# Patient Record
Sex: Female | Born: 1960 | Race: Black or African American | Hispanic: No | Marital: Single | Smoking: Current every day smoker
Health system: Southern US, Community
[De-identification: ages and names within clinical notes are randomized; demographics above are authoritative.]

## PROBLEM LIST (undated history)

## (undated) DIAGNOSIS — M5136 Other intervertebral disc degeneration, lumbar region: Secondary | ICD-10-CM

## (undated) DIAGNOSIS — E079 Disorder of thyroid, unspecified: Secondary | ICD-10-CM

## (undated) DIAGNOSIS — I10 Essential (primary) hypertension: Secondary | ICD-10-CM

## (undated) DIAGNOSIS — Z9289 Personal history of other medical treatment: Secondary | ICD-10-CM

## (undated) HISTORY — PX: BREAST CYST EXCISION: SHX579

## (undated) HISTORY — DX: Disorder of thyroid, unspecified: E07.9

## (undated) HISTORY — DX: Personal history of other medical treatment: Z92.89

## (undated) HISTORY — PX: WRIST SURGERY: SHX841

---

## 2017-12-15 ENCOUNTER — Other Ambulatory Visit: Payer: Self-pay

## 2017-12-15 ENCOUNTER — Encounter (HOSPITAL_COMMUNITY): Payer: Self-pay

## 2017-12-15 ENCOUNTER — Emergency Department (HOSPITAL_COMMUNITY): Payer: Medicaid - Out of State

## 2017-12-15 DIAGNOSIS — F17210 Nicotine dependence, cigarettes, uncomplicated: Secondary | ICD-10-CM | POA: Insufficient documentation

## 2017-12-15 DIAGNOSIS — R110 Nausea: Secondary | ICD-10-CM | POA: Diagnosis not present

## 2017-12-15 DIAGNOSIS — R1031 Right lower quadrant pain: Secondary | ICD-10-CM | POA: Diagnosis present

## 2017-12-15 DIAGNOSIS — I10 Essential (primary) hypertension: Secondary | ICD-10-CM | POA: Diagnosis not present

## 2017-12-15 DIAGNOSIS — R1033 Periumbilical pain: Secondary | ICD-10-CM | POA: Insufficient documentation

## 2017-12-15 DIAGNOSIS — R350 Frequency of micturition: Secondary | ICD-10-CM | POA: Diagnosis not present

## 2017-12-15 DIAGNOSIS — Z79899 Other long term (current) drug therapy: Secondary | ICD-10-CM | POA: Insufficient documentation

## 2017-12-15 LAB — COMPREHENSIVE METABOLIC PANEL
ALBUMIN: 4 g/dL (ref 3.5–5.0)
ALK PHOS: 71 U/L (ref 38–126)
ALT: 15 U/L (ref 14–54)
AST: 22 U/L (ref 15–41)
Anion gap: 13 (ref 5–15)
BUN: 11 mg/dL (ref 6–20)
CHLORIDE: 107 mmol/L (ref 101–111)
CO2: 22 mmol/L (ref 22–32)
Calcium: 9.3 mg/dL (ref 8.9–10.3)
Creatinine, Ser: 0.96 mg/dL (ref 0.44–1.00)
GFR calc non Af Amer: >60 mL/min (ref >60)
GLUCOSE: 102 mg/dL — AB (ref 65–99)
POTASSIUM: 3.8 mmol/L (ref 3.5–5.1)
SODIUM: 142 mmol/L (ref 135–145)
Total Bilirubin: 1.5 mg/dL — ABNORMAL HIGH (ref 0.3–1.2)
Total Protein: 7.1 g/dL (ref 6.5–8.1)

## 2017-12-15 LAB — URINALYSIS, ROUTINE W REFLEX MICROSCOPIC
Bilirubin Urine: NEGATIVE
Glucose, UA: NEGATIVE mg/dL
KETONES UR: 5 mg/dL — AB
Leukocytes, UA: NEGATIVE
Nitrite: NEGATIVE
PROTEIN: NEGATIVE mg/dL
Specific Gravity, Urine: 1.011 (ref 1.005–1.030)
pH: 6 (ref 5.0–8.0)

## 2017-12-15 LAB — CBC
HEMATOCRIT: 45 % (ref 36.0–46.0)
Hemoglobin: 14.8 g/dL (ref 12.0–15.0)
MCH: 28.2 pg (ref 26.0–34.0)
MCHC: 32.9 g/dL (ref 30.0–36.0)
MCV: 85.7 fL (ref 78.0–100.0)
Platelets: 157 10*3/uL (ref 150–400)
RBC: 5.25 MIL/uL — AB (ref 3.87–5.11)
RDW: 14.4 % (ref 11.5–15.5)
WBC: 9.4 10*3/uL (ref 4.0–10.5)

## 2017-12-15 LAB — LIPASE, BLOOD: LIPASE: 25 U/L (ref 11–51)

## 2017-12-15 MED ORDER — ONDANSETRON 4 MG PO TBDP
4.0000 mg | ORAL_TABLET | Freq: Once | ORAL | Status: AC | PRN
  Administered 2017-12-15: 4 mg via ORAL
  Filled 2017-12-15: qty 1

## 2017-12-15 MED ORDER — IOPAMIDOL (ISOVUE-300) INJECTION 61%
INTRAVENOUS | Status: AC
  Administered 2017-12-15: 100 mL
  Filled 2017-12-15: qty 100

## 2017-12-15 NOTE — ED Triage Notes (Signed)
Pt here for abdominal pain, sudden onset around 1200 today. States pain begins at her navel and radiates to her RLQ. Pt also reports nausea.

## 2017-12-15 NOTE — ED Notes (Signed)
Pt came to nurse first to ask when "MRI" was coming to get her.  No orders for MRI.  Called Hedges, PA and he was away a CT abd was supposed to be ordered, PA thought nurse put order in.

## 2017-12-16 ENCOUNTER — Emergency Department (HOSPITAL_COMMUNITY)
Admission: EM | Admit: 2017-12-16 | Discharge: 2017-12-16 | Disposition: A | Discharge status: Home / Self Care | Payer: Medicaid - Out of State | Attending: Emergency Medicine | Admitting: Emergency Medicine

## 2017-12-16 DIAGNOSIS — R1031 Right lower quadrant pain: Secondary | ICD-10-CM

## 2017-12-16 HISTORY — DX: Other intervertebral disc degeneration, lumbar region: M51.36

## 2017-12-16 HISTORY — DX: Essential (primary) hypertension: I10

## 2017-12-16 NOTE — ED Notes (Signed)
Pt states that she does not want the IV in her arm and will be re stuck if necessary

## 2017-12-16 NOTE — ED Provider Notes (Signed)
[facility] EMERGENCY DEPARTMENT Provider Note   CSN: [id number] Arrival date & time: 12/15/17  1443     History   Chief Complaint Chief Complaint  Patient presents with  . Abdominal Pain    HPI Cheyenne Patel is a 57 y.o. female with past medical history of hypertension, degenerative disc disease, cholelithiasis, presenting to the ED for acute onset of periumbilical abdominal pain that moved towards her right lower quadrant that began at noon on Tuesday.  Patient states pain is 5/10, with associated nausea.  She states nausea is improved by Zofran that was given in triage. Reports mild inc in urinary frequency. Denies fever or chills,  vomiting, diarrhea, constipation, vaginal bleeding or discharge.  Patient states her pain does not feel similar to when she was symptomatic with cholelithiasis.  No other complaints.  The history is provided by the patient.    Past Medical History:  Diagnosis Date  . DDD (degenerative disc disease), lumbar   . Hypertension     There are no active problems to display for this patient.   History reviewed. No pertinent surgical history.  OB History    No data available       Home Medications    Prior to Admission medications   Medication Sig Start Date End Date Taking? Authorizing Provider  amLODipine (NORVASC) 10 MG tablet Take 10 mg by mouth daily.   Yes [provider]  ibuprofen (ADVIL,MOTRIN) 200 MG tablet Take 400 mg by mouth every 6 (six) hours as needed.   Yes [provider]    Family History History reviewed. No pertinent family history.  Social History Social History   Tobacco Use  . Smoking status: Current Every Day Smoker    Packs/day: 0.10  . Smokeless tobacco: Never Used  Substance Use Topics  . Alcohol use: No    Frequency: Never  . Drug use: Not on file     Allergies   Patient has no known allergies.   Review of Systems Review of Systems  Constitutional: Negative  for appetite change, chills and fever.  Gastrointestinal: Positive for abdominal pain and nausea. Negative for constipation, diarrhea and vomiting.  Genitourinary: Positive for frequency. Negative for dysuria, vaginal bleeding and vaginal discharge.  All other systems reviewed and are negative.    Physical Exam Updated Vital Signs BP 133/85   Pulse 78   Temp 97.9 F (36.6 C) (Oral)   Resp 20   LMP 11/10/2004   SpO2 99%   Physical Exam  Constitutional: She appears well-developed and well-nourished. No distress.  Pt sleeping comfortably on evaluation.   HENT:  Head: Normocephalic and atraumatic.  Mouth/Throat: Oropharynx is clear and moist.  Eyes: Conjunctivae are normal.  Cardiovascular: Normal rate, regular rhythm, normal heart sounds and intact distal pulses.  Pulmonary/Chest: Effort normal and breath sounds normal. No respiratory distress.  Abdominal: Soft. Normal appearance and bowel sounds are normal. She exhibits no distension and no mass. There is tenderness (minimal) in the left lower quadrant. There is no rigidity, no rebound, no guarding and negative Murphy's sign. No hernia.  Neurological: She is alert.  Skin: Skin is warm.  Psychiatric: She has a normal mood and affect. Her behavior is normal.  Nursing note and vitals reviewed.    ED Treatments / Results  Labs (all labs ordered are listed, but only abnormal results are displayed) Labs Reviewed  COMPREHENSIVE METABOLIC PANEL - Abnormal; Notable for the following components:      Result Value  Glucose, Bld 102 (*)    Total Bilirubin 1.5 (*)    All other components within normal limits  CBC - Abnormal; Notable for the following components:   RBC 5.25 (*)    All other components within normal limits  URINALYSIS, ROUTINE W REFLEX MICROSCOPIC - Abnormal; Notable for the following components:   Hgb urine dipstick LARGE (*)    Ketones, ur 5 (*)    Bacteria, UA RARE (*)    Squamous Epithelial / LPF 0-5 (*)    All  other components within normal limits  LIPASE, BLOOD    EKG  EKG Interpretation None       Radiology Ct Abdomen Pelvis W Contrast  Result Date: 12/15/2017 CLINICAL DATA:  Acute onset of lower abdominal pain, radiating to the back. Nausea. EXAM: CT ABDOMEN AND PELVIS WITH CONTRAST TECHNIQUE: Multidetector CT imaging of the abdomen and pelvis was performed using the standard protocol following bolus administration of intravenous contrast. CONTRAST:  186mL ISOVUE-300 IOPAMIDOL (ISOVUE-300) INJECTION 61% COMPARISON:  None. FINDINGS: Lower chest: Minimal scarring is noted at the lung bases. Scattered coronary artery calcifications are seen. Hepatobiliary: The liver is unremarkable in appearance. Stones are seen within the gallbladder. There is question of trace pericholecystic fluid. Would correlate clinically for evidence of cholecystitis. The common bile duct remains normal in caliber. Pancreas: The pancreas is within normal limits. Spleen: The spleen is unremarkable in appearance. Adrenals/Urinary Tract: A small right renal cyst is noted. Nonspecific right-sided perinephric fluid is noted. The left kidney is unremarkable in appearance. No renal or ureteral stones are identified. The adrenal glands are unremarkable in appearance. Stomach/Bowel: The stomach is unremarkable in appearance. The small bowel is within normal limits. The appendix is normal in caliber, without evidence of appendicitis. The colon is unremarkable in appearance. Vascular/Lymphatic: Scattered calcification is seen along the abdominal aorta and its branches. The abdominal aorta is otherwise grossly unremarkable. The inferior vena cava is grossly unremarkable. No retroperitoneal lymphadenopathy is seen. No pelvic sidewall lymphadenopathy is identified. Reproductive: The bladder is decompressed and not well assessed. The uterus is unremarkable in appearance. The ovaries are grossly symmetric. No suspicious adnexal masses are seen. Other:  No additional soft tissue abnormalities are seen. Musculoskeletal: No acute osseous abnormalities are identified. Facet disease is noted at the lower lumbar spine. The visualized musculature is unremarkable in appearance. IMPRESSION: 1. Cholelithiasis. Question of trace pericholecystic fluid. Would correlate clinically for evidence of cholecystitis. 2. Nonspecific right-sided perinephric fluid. Small right renal cyst. 3. Scattered coronary artery calcifications. Aortic Atherosclerosis (ICD10-I70.0). Electronically Signed   By: Garald Balding M.D.   On: 12/15/2017 21:53    Procedures Procedures (including critical care time)  Medications Ordered in ED Medications  ondansetron (ZOFRAN-ODT) disintegrating tablet 4 mg (4 mg Oral Given 12/15/17 1614)  iopamidol (ISOVUE-300) 61 % injection (100 mLs  Contrast Given 12/15/17 2135)     Initial Impression / Assessment and Plan / ED Course  I have reviewed the triage vital signs and the nursing notes.  Pertinent labs & imaging results that were available during my care of the patient were reviewed by me and considered in my medical decision making (see chart for details).     Pt w RLQ abdominal pain. Patient is nontoxic, nonseptic appearing, in no apparent distress.  Patient's pain and other symptoms adequately managed in emergency department.  Labs, imaging and vitals reviewed. CT without evidence of appendicitis, or other acute abdominal pathology; does note cholelithiasis, though neg murphys sign and pt nontender  in RUQ; doubt cholecystitis - No leukocytosis. Pt well-appearing, not in distress. No pelvic complaints. Patient does not meet the SIRS or Sepsis criteria.  On exam patient does not have a surgical abdomen and there are no peritoneal signs.  No indication of appendicitis, bowel obstruction, bowel perforation, cholecystitis, diverticulitis. Patient discharged home with symptomatic treatment and given strict instructions for follow-up with their  primary care physician.  Pt safe for discharge.  Discussed results, findings, treatment and follow up. Patient advised of return precautions. Patient verbalized understanding and agreed with plan.  Final Clinical Impressions(s) / ED Diagnoses   Final diagnoses:  RLQ abdominal pain    ED Discharge Orders    None       Robinson, [place] N, PA-C 12/16/17 0518    Palumbo, April, MD 12/16/17 2947

## 2017-12-16 NOTE — Discharge Instructions (Signed)
Please schedule an appointment to follow up on your visit. You can take tylenol as needed for pain. Return to the ER if symptoms persist or worsen, fever, uncontrollable vomiting, or new or concerning symptoms.

## 2017-12-30 ENCOUNTER — Other Ambulatory Visit: Payer: Self-pay

## 2017-12-30 ENCOUNTER — Encounter (HOSPITAL_COMMUNITY): Payer: Self-pay | Admitting: *Deleted

## 2017-12-30 ENCOUNTER — Emergency Department (HOSPITAL_COMMUNITY): Payer: Medicaid - Out of State

## 2017-12-30 ENCOUNTER — Emergency Department (HOSPITAL_COMMUNITY)
Admission: EM | Admit: 2017-12-30 | Discharge: 2017-12-30 | Disposition: A | Discharge status: Home / Self Care | Payer: Medicaid - Out of State | Attending: Emergency Medicine | Admitting: Emergency Medicine

## 2017-12-30 DIAGNOSIS — Y92002 Bathroom of unspecified non-institutional (private) residence single-family (private) house as the place of occurrence of the external cause: Secondary | ICD-10-CM | POA: Diagnosis not present

## 2017-12-30 DIAGNOSIS — Y998 Other external cause status: Secondary | ICD-10-CM | POA: Diagnosis not present

## 2017-12-30 DIAGNOSIS — W182XXA Fall in (into) shower or empty bathtub, initial encounter: Secondary | ICD-10-CM | POA: Insufficient documentation

## 2017-12-30 DIAGNOSIS — I10 Essential (primary) hypertension: Secondary | ICD-10-CM | POA: Diagnosis not present

## 2017-12-30 DIAGNOSIS — W19XXXA Unspecified fall, initial encounter: Secondary | ICD-10-CM

## 2017-12-30 DIAGNOSIS — Z79899 Other long term (current) drug therapy: Secondary | ICD-10-CM | POA: Insufficient documentation

## 2017-12-30 DIAGNOSIS — M545 Low back pain: Secondary | ICD-10-CM | POA: Insufficient documentation

## 2017-12-30 DIAGNOSIS — Y93E1 Activity, personal bathing and showering: Secondary | ICD-10-CM | POA: Insufficient documentation

## 2017-12-30 DIAGNOSIS — F17200 Nicotine dependence, unspecified, uncomplicated: Secondary | ICD-10-CM | POA: Insufficient documentation

## 2017-12-30 MED ORDER — METHOCARBAMOL 500 MG PO TABS: 500.0000 mg | ORAL_TABLET | Freq: Two times a day (BID) | ORAL | 0 refills | Status: DC

## 2017-12-30 NOTE — ED Notes (Signed)
Patient asking for graham crackers - given a few packs.

## 2017-12-30 NOTE — Discharge Instructions (Signed)
Please take ibuprofen 600mg  every 6hrs as needed for back pain  You can also apply heat to the lower back to help with your symptoms.   I have written you a prescription for a muscle relaxer medicine should you choose to fill it. This medicine can make you drowsy so please do not drive, work or drink alcohol while taking it.   Follow up with your regular doctor if your symptoms are not improving in a week.   Return to the ER if you lose sensation in  your legs or feet, are unable to walk, lose bowel or bladder control or have any new or worsening symptoms.

## 2017-12-30 NOTE — ED Notes (Signed)
Patient transported to X-ray 

## 2017-12-30 NOTE — ED Triage Notes (Signed)
Pt reports slipping and falling this am in the shower. Now has lower back pain that radiates down her legs. Hx of same. Ambulatory at triage.

## 2017-12-30 NOTE — ED Provider Notes (Signed)
[facility] EMERGENCY DEPARTMENT Provider Note   CSN: [id number] Arrival date & time: 12/30/17  1937     History   Chief Complaint Chief Complaint  Patient presents with  . Fall  . Back Pain    HPI Cheyenne Patel is a 57 y.o. female.  HPI   Cheyenne Patel is a 57 year old female with history of degenerative disc disease and hypertension who presents to the emergency department for evaluation of back pain following a mechanical fall earlier today.  Patient states that she slipped and fell onto her right lower back while in the tub at about 7AM today.  Denies hitting her head or loss of consciousness.  States that she now has 7/10 severity right sided lower back pain which radiates to the posterior buttocks and thigh.  Pain is worsened with standing from a seated position.  She has not taken any over-the-counter medications for her symptoms.  She denies numbness, weakness, loss of bowel or bladder control, saddle anesthesia.  Is able to ambulate independently, although painful.  Past Medical History:  Diagnosis Date  . DDD (degenerative disc disease), lumbar   . Hypertension     There are no active problems to display for this patient.   History reviewed. No pertinent surgical history.  OB History    No data available       Home Medications    Prior to Admission medications   Medication Sig Start Date End Date Taking? Authorizing Provider  amLODipine (NORVASC) 10 MG tablet Take 10 mg by mouth daily.    [provider]  ibuprofen (ADVIL,MOTRIN) 200 MG tablet Take 400 mg by mouth every 6 (six) hours as needed.    [provider]    Family History History reviewed. No pertinent family history.  Social History Social History   Tobacco Use  . Smoking status: Current Every Day Smoker    Packs/day: 0.10  . Smokeless tobacco: Never Used  Substance Use Topics  . Alcohol use: No    Frequency: Never  . Drug use: Not on file      Allergies   Patient has no known allergies.   Review of Systems Review of Systems  Gastrointestinal: Negative for abdominal pain, nausea and vomiting.  Genitourinary: Negative for difficulty urinating, dysuria, frequency and hematuria.  Musculoskeletal: Positive for back pain. Negative for gait problem.  Skin: Negative for wound.  Neurological: Negative for weakness, numbness and headaches.     Physical Exam Updated Vital Signs BP 136/89 (BP Location: Right Arm)   Pulse 79   Temp 98.6 F (37 C) (Oral)   Resp 16   LMP 11/10/2004   SpO2 98%   Physical Exam  Constitutional: She is oriented to person, place, and time. She appears well-developed and well-nourished. No distress.  HENT:  Head: Normocephalic and atraumatic.  Eyes: Right eye exhibits no discharge. Left eye exhibits no discharge.  Pulmonary/Chest: Effort normal. No respiratory distress.  Abdominal: Soft. Bowel sounds are normal. There is no tenderness. There is no guarding.  No CVA tenderness.  Musculoskeletal:  Tenderness over several spinous processes of the lumbar spine.  Tender over right paraspinal muscles of the lumbar spine.  No step-off or deformity.  No ecchymosis over the back.  5/5 strength in bilateral ankle dorsiflexion/plantar flexion and knee extension/flexion.  Straight leg raise positive on the right.  DP pulses 2+ bilaterally.  Neurological: She is alert and oriented to person, place, and time. Coordination normal.  Patellar reflex 2+ bilaterally.  Distal sensation to light touch intact in bilateral lower extremities.  Gait normal and coordination and balance.  Skin: Skin is warm and dry. Capillary refill takes less than 2 seconds. She is not diaphoretic.  Psychiatric: She has a normal mood and affect. Her behavior is normal.  Nursing note and vitals reviewed.     ED Treatments / Results  Labs (all labs ordered are listed, but only abnormal results are displayed) Labs Reviewed - No data to  display  EKG  EKG Interpretation None       Radiology Dg Lumbar Spine Complete  Result Date: 12/30/2017 CLINICAL DATA:  Pain following fall EXAM: LUMBAR SPINE - COMPLETE 4+ VIEW COMPARISON:  None. FINDINGS: Frontal, lateral, spot lumbosacral lateral, and bilateral oblique views were obtained. There are 5 non-rib-bearing lumbar type vertebral bodies. Note that at L1, the transverse processes are slightly elongated, particularly on the right. There is no fracture or spondylolisthesis. The disc spaces appear unremarkable. There is facet osteoarthritic change at L4-5 and L5-S1 bilaterally. There are foci of aortic atherosclerosis. 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 No appreciable disc space narrowing. No fracture or spondylolisthesis. There is aortic atherosclerosis. Aortic Atherosclerosis (ICD10-I70.0). Electronically Signed   By: Lowella Grip III M.D.   On: 12/30/2017 12:42    Procedures Procedures (including critical care time)  Medications Ordered in ED Medications - No data to display   Initial Impression / Assessment and Plan / ED Course  I have reviewed the triage vital signs and the nursing notes.  Pertinent labs & imaging results that were available during my care of the patient were reviewed by me and considered in my medical decision making (see chart for details).     Patient presents with lower back pain following a mechanical fall earlier today.  X-ray of lumbar spine without acute fracture or abnormality. No neurological deficits and normal neuro exam.  Patient can walk but states is painful. No loss of bowel or bladder control. No concern for cauda equina.  RICE protocol and pain medicine indicated and discussed with patient.  Counseled patient to follow-up with her PCP if symptoms do not improve in a week.  Discussed return precautions and patient agrees and voiced understanding to plan.  Final Clinical Impressions(s) / ED Diagnoses    Final diagnoses:  Fall, initial encounter    ED Discharge Orders        Ordered    methocarbamol (ROBAXIN) 500 MG tablet  2 times daily     12/30/17 1308       Bernarda Caffey 12/30/17 1310    Gareth Morgan, MD 12/30/17 2147

## 2018-03-15 ENCOUNTER — Other Ambulatory Visit: Payer: Self-pay

## 2018-03-15 ENCOUNTER — Ambulatory Visit: Payer: Medicaid - Out of State | Admitting: Family Medicine

## 2018-03-15 ENCOUNTER — Ambulatory Visit (INDEPENDENT_AMBULATORY_CARE_PROVIDER_SITE_OTHER): Payer: Self-pay | Admitting: Family Medicine

## 2018-03-15 ENCOUNTER — Other Ambulatory Visit: Payer: Self-pay | Admitting: Family Medicine

## 2018-03-15 ENCOUNTER — Encounter: Payer: Self-pay | Admitting: Family Medicine

## 2018-03-15 ENCOUNTER — Telehealth: Payer: Self-pay | Admitting: Family Medicine

## 2018-03-15 VITALS — BP 142/98 | HR 70 | Temp 98.0°F | Ht 65.0 in | Wt 201.6 lb

## 2018-03-15 DIAGNOSIS — R3915 Urgency of urination: Secondary | ICD-10-CM

## 2018-03-15 DIAGNOSIS — M5386 Other specified dorsopathies, lumbar region: Secondary | ICD-10-CM | POA: Insufficient documentation

## 2018-03-15 LAB — POCT URINALYSIS DIP (MANUAL ENTRY)
Bilirubin, UA: NEGATIVE
Blood, UA: NEGATIVE
GLUCOSE UA: NEGATIVE mg/dL
Ketones, POC UA: NEGATIVE mg/dL
Leukocytes, UA: NEGATIVE
Nitrite, UA: NEGATIVE
PROTEIN UA: NEGATIVE mg/dL
SPEC GRAV UA: 1.02 (ref 1.010–1.025)
UROBILINOGEN UA: 0.2 U/dL
pH, UA: 6.5 (ref 5.0–8.0)

## 2018-03-15 MED ORDER — GABAPENTIN 100 MG PO CAPS: 100.0000 mg | ORAL_CAPSULE | Freq: Every day | ORAL | 3 refills | Status: DC

## 2018-03-15 NOTE — Patient Instructions (Signed)
It was great to meet you today! Thank you for letting me participate in your care!  Today, we discussed your lower back pain. I believe your low back pain is likely due to arthritic changes in your lumbar spine. Please take Tylenol and Ibuprofen together to help control the pain. I also believe you have Sciatica that is causing pain and burning in your right buttocks and right leg. I have prescribed you Gabapentin to take daily at night to help control the pain. Please also do the stretches everyday to help improve your symptoms. It may take two weeks to begin seeing improvement.  Please return in two weeks to see how you are doing and to do discuss routine health maintenance.   Be well, Harolyn Rutherford, DO PGY-1, [facility]

## 2018-03-15 NOTE — Telephone Encounter (Signed)
Pt would like her gabapentin sent to the [facility] on [address] instead of [address]. Pt said it's too expensive at [facility]. Please advise

## 2018-03-15 NOTE — Progress Notes (Signed)
Sending gabapentin prescription to different pharmacy due to cost being too high at [facility].

## 2018-03-15 NOTE — Progress Notes (Cosign Needed)
     Subjective: Chief Complaint  Patient presents with  . Establish Care    HPI: Cheyenne Patel is a 57 y.o. presenting to clinic today to discuss the following:  Right sided buttock, hip, and leg pain Patient is new and establishing care today. Mainly today she is concerned about not being able to sleep well for the past 4 months due to right sided pain occurring in her hip and buttocks. The pain is described as burning gets to a 10/10 at times and radiates down the back of her right leg. She has more pain when sleeping on her right side and in the morning. It improves with position changes and walking. It is occurring everyday intermittently.  She denies fever, chills, weight loss, CP, SOB, difficulty breathing, abdominal pain, diarrhea, constipation, nausea, vomiting, or joint pain  Health Maintenance: None today    ROS noted in HPI.   Past Medical, Surgical, Social, and Family History Reviewed & Updated per EMR.   Pertinent Historical Findings include: History of Hyperthyroidism followed by Hypothyroidism  Social History   Tobacco Use  Smoking Status Current Every Day Smoker  . Packs/day: 0.10  Smokeless Tobacco Never Used   Objective: BP (!) 142/98   Pulse 70   Temp 98 F (36.7 C) (Oral)   Ht 5\' 5"  (1.651 m)   Wt 201 lb 9.6 oz (91.4 kg)   LMP 11/10/2004   SpO2 99%   BMI 33.55 kg/m  Vitals and nursing notes reviewed  Physical Exam  Constitutional: She is oriented to person, place, and time. She appears well-developed and well-nourished. No distress.  HENT:  Head: Normocephalic and atraumatic.  Eyes: Pupils are equal, round, and reactive to light. EOM are normal.  Neck: Normal range of motion. No thyromegaly present.  Cardiovascular: Normal rate, regular rhythm, normal heart sounds and intact distal pulses.  No murmur heard. Pulmonary/Chest: Effort normal and breath sounds normal. No respiratory distress. She has no wheezes. She has no rales.  Abdominal:  Soft. Bowel sounds are normal. She exhibits no distension. There is no tenderness. There is no guarding.  Musculoskeletal: Normal range of motion. She exhibits no edema.  TTP at greater trochanter on the right, ROM wnl for right hip and knee   Lymphadenopathy:    She has no cervical adenopathy.  Neurological: She is alert and oriented to person, place, and time.  Skin: Skin is warm and dry. Capillary refill takes less than 2 seconds. No rash noted. No erythema.  Psychiatric: She has a normal mood and affect.   No results found for this or any previous visit (from the past 72 hour(s)).  Assessment/Plan:  No problem-specific Assessment & Plan notes found for this encounter.     PATIENT EDUCATION PROVIDED: See AVS    Diagnosis and plan along with any newly prescribed medication(s) were discussed in detail with this patient today. The patient verbalized understanding and agreed with the plan. Patient advised if symptoms worsen return to clinic or ER.   Health Maintainance:   Orders Placed This Encounter  Procedures  . POCT urinalysis dipstick    Meds ordered this encounter  Medications  . DISCONTD: gabapentin (NEURONTIN) 100 MG capsule    Sig: Take 1 capsule (100 mg total) by mouth at bedtime.    Dispense:  90 capsule    Refill:  [place], DO 03/17/2018, 1:49 PM PGY-1, [facility]

## 2018-03-17 ENCOUNTER — Encounter: Payer: Self-pay | Admitting: Family Medicine

## 2018-03-17 DIAGNOSIS — E079 Disorder of thyroid, unspecified: Secondary | ICD-10-CM | POA: Insufficient documentation

## 2018-03-17 DIAGNOSIS — I10 Essential (primary) hypertension: Secondary | ICD-10-CM | POA: Insufficient documentation

## 2018-03-17 NOTE — Assessment & Plan Note (Signed)
Her pain is most likely caused by sciatic nerve impingement given symptoms and exam findings. Could also be a component of trochanteric bursitis.  She also has facet degeneration in her lumbar spine that could be contributing to her condition.  Recommended stretches for her to due everyday to help piriformis muscle. Prescribed Gabapentin 100mg  daily at night to help with nerve pain.  Cont Ibuprofen and Tylenol together for back pain.

## 2018-04-06 ENCOUNTER — Ambulatory Visit: Payer: Self-pay | Admitting: Family Medicine

## 2018-04-21 ENCOUNTER — Ambulatory Visit: Payer: Self-pay

## 2018-05-05 ENCOUNTER — Ambulatory Visit: Payer: Self-pay

## 2018-05-20 ENCOUNTER — Emergency Department (HOSPITAL_COMMUNITY)
Admission: EM | Admit: 2018-05-20 | Discharge: 2018-05-20 | Disposition: A | Discharge status: Home / Self Care | Payer: Medicaid - Out of State | Attending: Emergency Medicine | Admitting: Emergency Medicine

## 2018-05-20 ENCOUNTER — Encounter (HOSPITAL_COMMUNITY): Payer: Self-pay | Admitting: Emergency Medicine

## 2018-05-20 ENCOUNTER — Other Ambulatory Visit: Payer: Self-pay

## 2018-05-20 ENCOUNTER — Emergency Department (HOSPITAL_COMMUNITY): Payer: Medicaid - Out of State

## 2018-05-20 DIAGNOSIS — Z79899 Other long term (current) drug therapy: Secondary | ICD-10-CM | POA: Diagnosis not present

## 2018-05-20 DIAGNOSIS — I10 Essential (primary) hypertension: Secondary | ICD-10-CM | POA: Diagnosis not present

## 2018-05-20 DIAGNOSIS — M545 Low back pain: Secondary | ICD-10-CM | POA: Diagnosis not present

## 2018-05-20 DIAGNOSIS — R7989 Other specified abnormal findings of blood chemistry: Secondary | ICD-10-CM

## 2018-05-20 DIAGNOSIS — R1310 Dysphagia, unspecified: Secondary | ICD-10-CM | POA: Insufficient documentation

## 2018-05-20 DIAGNOSIS — F17210 Nicotine dependence, cigarettes, uncomplicated: Secondary | ICD-10-CM | POA: Insufficient documentation

## 2018-05-20 LAB — CK: CK TOTAL: 331 U/L — AB (ref 38–234)

## 2018-05-20 LAB — COMPREHENSIVE METABOLIC PANEL
ALT: 15 U/L (ref 0–44)
ANION GAP: 13 (ref 5–15)
AST: 23 U/L (ref 15–41)
Albumin: 4 g/dL (ref 3.5–5.0)
Alkaline Phosphatase: 62 U/L (ref 38–126)
BILIRUBIN TOTAL: 1.6 mg/dL — AB (ref 0.3–1.2)
BUN: 16 mg/dL (ref 6–20)
CALCIUM: 9.1 mg/dL (ref 8.9–10.3)
CO2: 23 mmol/L (ref 22–32)
Chloride: 104 mmol/L (ref 98–111)
Creatinine, Ser: 1.59 mg/dL — ABNORMAL HIGH (ref 0.44–1.00)
GFR, EST AFRICAN AMERICAN: 41 mL/min — AB (ref >60)
GFR, EST NON AFRICAN AMERICAN: 36 mL/min — AB (ref >60)
Glucose, Bld: 129 mg/dL — ABNORMAL HIGH (ref 70–99)
Potassium: 3.6 mmol/L (ref 3.5–5.1)
Sodium: 140 mmol/L (ref 135–145)
TOTAL PROTEIN: 7 g/dL (ref 6.5–8.1)

## 2018-05-20 LAB — CBC
HCT: 47.8 % — ABNORMAL HIGH (ref 36.0–46.0)
HEMOGLOBIN: 15.2 g/dL — AB (ref 12.0–15.0)
MCH: 27.4 pg (ref 26.0–34.0)
MCHC: 31.8 g/dL (ref 30.0–36.0)
MCV: 86.3 fL (ref 78.0–100.0)
Platelets: 158 10*3/uL (ref 150–400)
RBC: 5.54 MIL/uL — AB (ref 3.87–5.11)
RDW: 13.7 % (ref 11.5–15.5)
WBC: 9.5 10*3/uL (ref 4.0–10.5)

## 2018-05-20 LAB — I-STAT BETA HCG BLOOD, ED (MC, WL, AP ONLY): HCG, QUANTITATIVE: 6.3 m[IU]/mL — AB (ref <5)

## 2018-05-20 LAB — TSH: TSH: 0.094 u[IU]/mL — AB (ref 0.350–4.500)

## 2018-05-20 LAB — I-STAT TROPONIN, ED: TROPONIN I, POC: 0.01 ng/mL (ref 0.00–0.08)

## 2018-05-20 LAB — LIPASE, BLOOD: Lipase: 28 U/L (ref 11–51)

## 2018-05-20 LAB — SEDIMENTATION RATE: SED RATE: 3 mm/h (ref 0–22)

## 2018-05-20 MED ORDER — ORPHENADRINE CITRATE ER 100 MG PO TB12
100.0000 mg | ORAL_TABLET | Freq: Two times a day (BID) | ORAL | Status: DC
  Administered 2018-05-20: 100 mg via ORAL
  Filled 2018-05-20: qty 1

## 2018-05-20 MED ORDER — OMEPRAZOLE 20 MG PO CPDR: 20.0000 mg | DELAYED_RELEASE_CAPSULE | Freq: Every day | ORAL | 0 refills | Status: DC

## 2018-05-20 MED ORDER — ORPHENADRINE CITRATE ER 100 MG PO TB12: 100.0000 mg | ORAL_TABLET | Freq: Two times a day (BID) | ORAL | 0 refills | Status: DC

## 2018-05-20 MED ORDER — TRAMADOL HCL 50 MG PO TABS: ORAL_TABLET | ORAL | 0 refills | Status: DC

## 2018-05-20 MED ORDER — AMLODIPINE BESYLATE 10 MG PO TABS: 10.0000 mg | ORAL_TABLET | Freq: Every day | ORAL | 1 refills | Status: DC

## 2018-05-20 MED ORDER — AMLODIPINE BESYLATE 5 MG PO TABS
10.0000 mg | ORAL_TABLET | Freq: Once | ORAL | Status: AC
  Administered 2018-05-20: 10 mg via ORAL
  Filled 2018-05-20: qty 2

## 2018-05-20 NOTE — Discharge Instructions (Signed)
1.  Schedule appointment at the wellness clinic as soon as possible to restart your endocrine evaluation for thyroid problems. 2.  Restart your blood pressure medication as prescribed.  Monitor your pressures at home.  Do not take any medications of the type called NSAID (ibuprofen, Motrin, Aleve, naproxen, meloxicam), these medications can make your blood pressure worse when it is uncontrolled and can damage the kidneys in people with poorly controlled blood pressure.  Your should let you know when and if it is safe to take these medications for pain again. 3.  For back pain, you can take tramadol and Norflex at this time.  Follow-up with your doctor to determine if this is helpful or if you need additional diagnostic studies or interventional treatments. 4.  Return to the emergency department for having worsening or changing symptoms.

## 2018-05-20 NOTE — ED Provider Notes (Signed)
[facility] EMERGENCY DEPARTMENT Provider Note   CSN: [id number] Arrival date & time: 05/20/18  3893     History   Chief Complaint Chief Complaint  Patient presents with  . Shortness of Breath  . Abdominal Pain    HPI Cheyenne Patel is a 57 y.o. female.  HPI Patient has moved to the area within about the last year.  Most of her medical records are in [place] where she moved from.  She reports history of spine and back problems, hypertension and prior evaluation of her thyroid by ultrasound without any identified abnormality.  Patient reports for several months now she has been feeling like the tissues on the side of her neck are getting more prominent.  She reports that the past several days she is noticing pain with swallowing.  She reports food burns as it goes down her esophagus.  No difficulty breathing.  No cough.  She reports she is also having lower back pain.  This has been a intermittent and persistent problem.  She has had problems both with the lower back and the neck.  She reports she has had MRIs in the past.  She is told that her spine is narrowing.  She reports last night she noticed pain in her upper and lower abdomen.  Quality was burning in nature.  She did not have vomiting or diarrhea.  No pain burning urgency with urination.  She reports aching in her legs.  She reports the front lateral aspect of the left thigh has had a numb patch for several months. Past Medical History:  Diagnosis Date  . DDD (degenerative disc disease), lumbar   . Hypertension     Patient Active Problem List   Diagnosis Date Noted  . HTN (hypertension) 03/17/2018  . Thyroid disease 03/17/2018  . Sciatica of right side associated with disorder of lumbar spine 03/15/2018    History reviewed. No pertinent surgical history.   OB History   None      Home Medications    Prior to Admission medications   Medication Sig Start Date End Date Taking? Authorizing  Provider  ibuprofen (ADVIL,MOTRIN) 200 MG tablet Take 400 mg by mouth every 6 (six) hours as needed.   Yes [provider]  amLODipine (NORVASC) 10 MG tablet Take 10 mg by mouth daily.    [provider]  amLODipine (NORVASC) 10 MG tablet Take 1 tablet (10 mg total) by mouth daily. 05/20/18   Charlesetta Shanks, MD  gabapentin (NEURONTIN) 100 MG capsule Take 1 capsule (100 mg total) by mouth at bedtime. Patient not taking: Reported on 05/20/2018 03/15/18   Nuala Alpha, DO  methocarbamol (ROBAXIN) 500 MG tablet Take 1 tablet (500 mg total) by mouth 2 (two) times daily. Patient not taking: Reported on 05/20/2018 12/30/17   Glyn Ade, PA-C  omeprazole (PRILOSEC) 20 MG capsule Take 1 capsule (20 mg total) by mouth daily. 05/20/18   Charlesetta Shanks, MD  orphenadrine (NORFLEX) 100 MG tablet Take 1 tablet (100 mg total) by mouth 2 (two) times daily. 05/20/18   Charlesetta Shanks, MD  traMADol (ULTRAM) 50 MG tablet 1 to 2 tablets every 6 hours as needed for back pain. 05/20/18   Charlesetta Shanks, MD    Family History No family history on file.  Social History Social History   Tobacco Use  . Smoking status: Current Every Day Smoker    Packs/day: 0.10  . Smokeless tobacco: Never Used  Substance Use Topics  . Alcohol  use: No    Frequency: Never  . Drug use: Not on file     Allergies   Patient has no known allergies.   Review of Systems Review of Systems 10 Systems reviewed and are negative for acute change except as noted in the HPI.   Physical Exam Updated Vital Signs BP (!) 156/100   Pulse 67   Temp 98 F (36.7 C) (Oral)   Resp 19   Ht 5\' 5"  (1.651 m)   Wt 91.2 kg (201 lb)   LMP 11/10/2004   SpO2 95%   BMI 33.45 kg/m   Physical Exam  Constitutional: She is oriented to person, place, and time. She appears well-developed and well-nourished. No distress.  HENT:  Head: Normocephalic and atraumatic.  Posterior oropharynx widely patent.  No exudate or  erythema.  Dentition is in good condition with prior dental work.  No facial swelling.  Eyes: Pupils are equal, round, and reactive to light. EOM are normal.  Neck:  No thyromegaly.  No palpable nodules or lymphadenopathy.  No stridor.  No facial tenderness or swelling.  Cardiovascular: Normal rate, regular rhythm, normal heart sounds and intact distal pulses.  Pulmonary/Chest: Effort normal and breath sounds normal.  Abdominal: Soft. She exhibits no distension. There is no tenderness. There is no guarding.  Musculoskeletal: Normal range of motion. She exhibits no edema, tenderness or deformity.  Neurological: She is alert and oriented to person, place, and time. No cranial nerve deficit. She exhibits normal muscle tone. Coordination normal.  Skin: Skin is warm and dry.  Psychiatric: She has a normal mood and affect.     ED Treatments / Results  Labs (all labs ordered are listed, but only abnormal results are displayed) Labs Reviewed  COMPREHENSIVE METABOLIC PANEL - Abnormal; Notable for the following components:      Result Value   Glucose, Bld 129 (*)    Creatinine, Ser 1.59 (*)    Total Bilirubin 1.6 (*)    GFR calc non Af Amer 36 (*)    GFR calc Af Amer 41 (*)    All other components within normal limits  CBC - Abnormal; Notable for the following components:   RBC 5.54 (*)    Hemoglobin 15.2 (*)    HCT 47.8 (*)    All other components within normal limits  TSH - Abnormal; Notable for the following components:   TSH 0.094 (*)    All other components within normal limits  CK - Abnormal; Notable for the following components:   Total CK 331 (*)    All other components within normal limits  I-STAT BETA HCG BLOOD, ED ([facility], [facility], [facility] ONLY) - Abnormal; Notable for the following components:   I-stat hCG, quantitative 6.3 (*)    All other components within normal limits  LIPASE, BLOOD  URINALYSIS, ROUTINE W REFLEX MICROSCOPIC  T3, FREE  T4  SEDIMENTATION RATE  I-STAT TROPONIN, ED      EKG EKG Interpretation  Date/Time:  Thursday May 20 2018 06:49:41 EDT Ventricular Rate:  106 PR Interval:  154 QRS Duration: 84 QT Interval:  362 QTC Calculation: 480 R Axis:   51 Text Interpretation:  Sinus tachycardia Right atrial enlargement diffuse Twave flattening. no STEMI. NO old comparison Confirmed by Charlesetta Shanks [phone]) on 05/20/2018 8:25:59 AM   Radiology Dg Neck Soft Tissue  Result Date: 05/20/2018 CLINICAL DATA:  Dysphagia. EXAM: NECK SOFT TISSUES - 1+ VIEW COMPARISON:  None. FINDINGS: There is no evidence of retropharyngeal soft tissue  swelling or epiglottic enlargement. The cervical airway is unremarkable and no radio-opaque foreign body identified. Cervical spine degenerative changes incidentally noted. IMPRESSION: Unremarkable appearance of neck soft tissues. Cervical spondylosis incidentally noted. Electronically Signed   By: Earle Gell M.D.   On: 05/20/2018 09:20   Dg Chest 2 View  Result Date: 05/20/2018 CLINICAL DATA:  DYSPHAGIA. EXAM: CHEST - 2 VIEW COMPARISON:  None. FINDINGS: The heart size and mediastinal contours are within normal limits. Mild scarring noted in the lingula. Lungs are otherwise clear. The visualized skeletal structures are unremarkable. IMPRESSION: No active cardiopulmonary disease. Electronically Signed   By: Earle Gell M.D.   On: 05/20/2018 09:18    Procedures Procedures (including critical care time)  Medications Ordered in ED Medications  amLODipine (NORVASC) tablet 10 mg (has no administration in time range)  orphenadrine (NORFLEX) 12 hr tablet 100 mg (has no administration in time range)     Initial Impression / Assessment and Plan / ED Course  I have reviewed the triage vital signs and the nursing notes.  Pertinent labs & imaging results that were available during my care of the patient were reviewed by me and considered in my medical decision making (see chart for details).     Final Clinical Impressions(s) / ED  Diagnoses   Final diagnoses:  Abnormal thyroid stimulating hormone (TSH) level  Dysphagia, unspecified type  Essential hypertension  Midline low back pain, unspecified chronicity, with sciatica presence unspecified   Patient presents with several different complaints.  One complaint is dysphagia.  She perceives tightness and burning when she swallows and is eating.  This goes from her throat down to her epigastrium.  Sometimes she perceives epigastrium to be bloated and firm with burning quality.  She also endorses symptoms worse at nighttime when supine.  At this time there is no reproducible pain to palpation.  Patient is clinically well.  Will institute a trial of omeprazole.  Patient is given instructions for management of GERD.  Patient also has a history of thyroid dysfunction.  He reported having a normal ultrasound several years ago but also apparently was treated on medication.  She cannot recall the name of the medication.  She reports that her numbers "with swaying back and forth from high to low".  She reports that sometimes she will get fullness around the neck and swelling of an eye and then it would go away.  Finally she just stopped taking the medication and has not had problems since.  She does not endorse any positives on review of systems that suggest symptomatic hyperthyroid.  She understands that she needs to have close follow-up at the wellness center and get referral to endocrinology for further investigation of her thyroid dysfunction.  On exam I do not feel any nodules or enlargement.  There is no critical pressure on the airway.  I feel this is stable for further outpatient work-up.  Patient also complains of low back pain.  She reports she has had this for a long time.  Reports sometimes she gets pain that radiates down her legs.  She reports she has been diagnosed with degenerative joint disease.  She has been taking ibuprofen regularly for pain.  Patient is neurovascularly  intact.  At this time I do not see indication for emergent MRI.  She describes prior MRI but that was not done in our healthcare system.  Do to the patient's untreated hypertension (due to being out of medication and not having health care due to  lack of insurance for period of time), patient is counseled to discontinue all NSAIDs.  At this time pain should be treated with a combination of tramadol and Norflex.  She is advised that she will have to discuss with her doctor when and if it is appropriate for her to resume use of NSAIDs.  Patient reports she did recently get her orange card and is established at the wellness center.  She will schedule her follow-up appointment to begin addressing these multiple subacute medical conditions. ED Discharge Orders        Ordered    traMADol (ULTRAM) 50 MG tablet     05/20/18 1036    orphenadrine (NORFLEX) 100 MG tablet  2 times daily     05/20/18 1036    omeprazole (PRILOSEC) 20 MG capsule  Daily     05/20/18 1036    amLODipine (NORVASC) 10 MG tablet  Daily     05/20/18 1036       Charlesetta Shanks, MD 05/20/18 1043

## 2018-05-20 NOTE — ED Notes (Signed)
Patient transported to X-ray 

## 2018-05-20 NOTE — ED Notes (Signed)
Pt reports not having a PCP, trouble swallowing solids for about 2.5 days, constant abd pain. N/V/D that started last night.

## 2018-05-20 NOTE — ED Triage Notes (Signed)
Pt reports her throat has been "closing up, giving me trouble swallowing for three days."  Pt is able to speak in complete sentences but has had to clear her throat several times.  Pt states her stomach began to cause her pain last night.

## 2018-05-21 LAB — T4: T4, Total: 8.1 ug/dL (ref 4.5–12.0)

## 2018-05-21 LAB — T3, FREE: T3, Free: 2.9 pg/mL (ref 2.0–4.4)

## 2018-05-26 ENCOUNTER — Ambulatory Visit (INDEPENDENT_AMBULATORY_CARE_PROVIDER_SITE_OTHER): Payer: Self-pay | Admitting: Family Medicine

## 2018-05-26 ENCOUNTER — Other Ambulatory Visit: Payer: Self-pay

## 2018-05-26 ENCOUNTER — Encounter: Payer: Self-pay | Admitting: Family Medicine

## 2018-05-26 VITALS — BP 118/70 | HR 80 | Temp 98.6°F | Ht 65.0 in | Wt 200.0 lb

## 2018-05-26 DIAGNOSIS — K219 Gastro-esophageal reflux disease without esophagitis: Secondary | ICD-10-CM

## 2018-05-26 DIAGNOSIS — M5386 Other specified dorsopathies, lumbar region: Secondary | ICD-10-CM

## 2018-05-26 DIAGNOSIS — N179 Acute kidney failure, unspecified: Secondary | ICD-10-CM

## 2018-05-26 DIAGNOSIS — E079 Disorder of thyroid, unspecified: Secondary | ICD-10-CM

## 2018-05-26 MED ORDER — AMLODIPINE BESYLATE 10 MG PO TABS: 10.0000 mg | ORAL_TABLET | Freq: Every day | ORAL | 3 refills | Status: DC

## 2018-05-26 MED ORDER — OMEPRAZOLE 20 MG PO CPDR: 20.0000 mg | DELAYED_RELEASE_CAPSULE | Freq: Every day | ORAL | 6 refills | Status: AC

## 2018-05-26 MED ORDER — GABAPENTIN 100 MG PO CAPS: 100.0000 mg | ORAL_CAPSULE | Freq: Every day | ORAL | 3 refills | Status: AC

## 2018-05-26 NOTE — Patient Instructions (Addendum)
It was great to see you today! Thank you for letting me participate in your care!  Today, we discussed your recent ED visit. I am glad your abdominal pain is better. Please continue taking your omeprazole.   I have also refilled your Gabapentin and your Amlodipine.  I will recheck your kidney function and your thyroid function today. I have referred you to an endocrinologist for help in managing your history of thyroid disease.  Be well, Harolyn Rutherford, DO PGY-2, [facility]

## 2018-05-26 NOTE — Progress Notes (Signed)
Subjective: Chief Complaint  Patient presents with  . ED follow up    had abnormal labs at recent ED visit     HPI: Cheyenne Patel is a 57 y.o. presenting to clinic today to discuss the following:  ED Visit Follow Up Cheyenne Patel recently was seen in the [facility] ED due to stomach pain. Her work up was mostly benign and her symptoms were thought to be due to GERD and a viral GI infection for which she was started on a PPI and supportive care. She has been taking the PPI and her symptoms are greatly improved. She was incidentally found to have an AKI during her work up in the ED. It is most likely caused by dehydration secondary to the nausea and vomiting she was experiencing. Repeat CMP to ensure AKI has resolved. Patient has been drinking lots of water. No dysphagia today or since ED visit.  Hypothyroidism During her ED visit her TSH was found to be low. She has a complicated history of hyper/hypothyroidism. She states at one point she was hyperthyroid but then became hypothyroid. She then states that she was stopped on all medications because it became normal and she was told not to take anything by her doctor in [place]. I have sent her a referral to Endocrinology for further workup and assessment.   Health Maintenance: None today     ROS noted in HPI.   Past Medical, Surgical, Social, and Family History Reviewed & Updated per EMR.   Pertinent Historical Findings include:   Social History   Tobacco Use  Smoking Status Current Every Day Smoker  . Packs/day: 0.10  Smokeless Tobacco Never Used    Objective: BP 118/70 (BP Location: Left Arm, Patient Position: Sitting, Cuff Size: Large)   Pulse 80   Temp 98.6 F (37 C) (Oral)   Ht 5\' 5"  (1.651 m)   Wt 200 lb (90.7 kg)   LMP 11/10/2004   SpO2 98%   BMI 33.28 kg/m  Vitals and nursing notes reviewed  Physical Exam Gen: Alert and Oriented x 3, NAD HEENT: Normocephalic, atraumatic, PERRLA, EOMI, non-swollen,  non-erythematous turbinates, non-erythematous pharyngeal mucosa, no exudates Neck: trachea midline, no thyroidmegaly, no LAD CV: RRR, no murmurs, normal S1, S2 split, +2 pulses dorsalis pedis bilaterally Resp: CTAB, no wheezing, rales, or rhonchi, comfortable work of breathing Abd: non-distended, non-tender, soft, +bs in all four quadrants, no hepatosplenomegaly MSK: FROM in all four extremities Ext: no clubbing, cyanosis, or edema Skin: warm, dry, intact, no rashes  No results found for this or any previous visit (from the past 72 hour(s)).  Assessment/Plan:  Thyroid disease Given her complicated history I have sent in a referral to Endocrinology for further management.  AKI (acute kidney injury) ([place]) Repeat BMP shows resolution of AKI with Creatnine back to baseline and within normal limits.   PATIENT EDUCATION PROVIDED: See AVS    Diagnosis and plan along with any newly prescribed medication(s) were discussed in detail with this patient today. The patient verbalized understanding and agreed with the plan. Patient advised if symptoms worsen return to clinic or ER.   Health Maintainance:   Orders Placed This Encounter  Procedures  . Comprehensive metabolic panel  . TSH  . Ambulatory referral to Endocrinology    Referral Priority:   Routine    Referral Type:   Consultation    Referral Reason:   Specialty Services Required    Number of Visits Requested:   1  Meds ordered this encounter  Medications  . gabapentin (NEURONTIN) 100 MG capsule    Sig: Take 1 capsule (100 mg total) by mouth at bedtime.    Dispense:  90 capsule    Refill:  3  . omeprazole (PRILOSEC) 20 MG capsule    Sig: Take 1 capsule (20 mg total) by mouth daily.    Dispense:  30 capsule    Refill:  6  . amLODipine (NORVASC) 10 MG tablet    Sig: Take 1 tablet (10 mg total) by mouth daily.    Dispense:  30 tablet    Refill:  [place], DO 05/26/2018, 11:03 AM PGY-2 [facility]

## 2018-05-27 LAB — COMPREHENSIVE METABOLIC PANEL
ALBUMIN: 3.9 g/dL (ref 3.5–5.5)
ALT: 10 IU/L (ref 0–32)
AST: 15 IU/L (ref 0–40)
Albumin/Globulin Ratio: 1.6 (ref 1.2–2.2)
Alkaline Phosphatase: 56 IU/L (ref 39–117)
BUN / CREAT RATIO: 19 (ref 9–23)
BUN: 19 mg/dL (ref 6–24)
Bilirubin Total: 1.1 mg/dL (ref 0.0–1.2)
CHLORIDE: 109 mmol/L — AB (ref 96–106)
CO2: 22 mmol/L (ref 20–29)
CREATININE: 0.99 mg/dL (ref 0.57–1.00)
Calcium: 9.1 mg/dL (ref 8.7–10.2)
GFR calc Af Amer: 74 mL/min/{1.73_m2} (ref >59)
GFR calc non Af Amer: 64 mL/min/{1.73_m2} (ref >59)
GLOBULIN, TOTAL: 2.5 g/dL (ref 1.5–4.5)
Glucose: 81 mg/dL (ref 65–99)
Potassium: 3.6 mmol/L (ref 3.5–5.2)
SODIUM: 147 mmol/L — AB (ref 134–144)
Total Protein: 6.4 g/dL (ref 6.0–8.5)

## 2018-05-27 LAB — TSH: TSH: 0.119 u[IU]/mL — AB (ref 0.450–4.500)

## 2018-05-31 ENCOUNTER — Encounter: Payer: Self-pay | Admitting: Family Medicine

## 2018-05-31 NOTE — Assessment & Plan Note (Signed)
Repeat BMP shows resolution of AKI with Creatnine back to baseline and within normal limits.

## 2018-05-31 NOTE — Assessment & Plan Note (Signed)
Given her complicated history I have sent in a referral to Endocrinology for further management.

## 2018-06-17 ENCOUNTER — Ambulatory Visit (INDEPENDENT_AMBULATORY_CARE_PROVIDER_SITE_OTHER): Payer: Self-pay | Admitting: Family Medicine

## 2018-06-17 ENCOUNTER — Encounter: Payer: Self-pay | Admitting: Family Medicine

## 2018-06-17 VITALS — BP 140/80 | HR 81 | Temp 99.0°F | Wt 198.0 lb

## 2018-06-17 DIAGNOSIS — M5441 Lumbago with sciatica, right side: Secondary | ICD-10-CM | POA: Insufficient documentation

## 2018-06-17 MED ORDER — KETOROLAC TROMETHAMINE 30 MG/ML IJ SOLN: 30.0000 mg | Freq: Once | INTRAMUSCULAR | Status: DC

## 2018-06-17 MED ORDER — KETOROLAC TROMETHAMINE 30 MG/ML IJ SOLN
30.0000 mg | Freq: Once | INTRAMUSCULAR | Status: AC
  Administered 2018-06-17: 30 mg via INTRAMUSCULAR

## 2018-06-17 NOTE — Progress Notes (Signed)
     Subjective: Chief Complaint  Patient presents with  . Back Pain   HPI: Cheyenne Patel is a 57 y.o. presenting to clinic today to discuss the following:  Back Pain Same as before and has been getting slightly worse. She is having right sided low back pain in the lumbar area that shoots down her right leg. She is also experiencing left sided leg numbness and tingling that remains unchanged since her previous visit. She has lost some weight and has been doing the exercises and stretches that I gave her at her last appointment. She is having an acute attack of back pain today. She is working more and on her feet on a concrete floor almost all day which she think may be making things worse.  She denies fever, chills, abdominal pain, chest pain, loss of bladder or bowel function, paresthesia of her inner thighs or groin area.  Health Maintenance: None    ROS noted in HPI.   Past Medical, Surgical, Social, and Family History Reviewed & Updated per EMR.   Pertinent Historical Findings include:   Social History   Tobacco Use  Smoking Status Current Every Day Smoker  . Packs/day: 0.10  Smokeless Tobacco Never Used    Objective: BP 140/80   Pulse 81   Temp 99 F (37.2 C) (Oral)   Wt 198 lb (89.8 kg)   LMP 11/10/2004   SpO2 97%   BMI 32.95 kg/m  Vitals and nursing notes reviewed  Physical Exam Gen: Alert and Oriented x 3, NAD HEENT: Normocephalic, atraumatic, PERRLA, EOMI Neck: trachea midline, no thyroidmegaly, no LAD CV: RRR, no murmurs, normal S1, S2 split, +2 pulses dorsalis pedis bilaterally Resp: CTAB, no wheezing, rales, or rhonchi, comfortable work of breathing MSK: decreased standing flexion and left sided sidebending, otherwise spine range of motion normal. LE strength 5/5 bilaterally Ext: no clubbing, cyanosis, or edema Skin: warm, dry, intact, no rashes  No results found for this or any previous visit (from the past 72 hour(s)).  Assessment/Plan:  Acute  right-sided low back pain with right-sided sciatica Patient is compliant with home exercises and stretches but not seeing improvement in back pain. She will try to see if she can afford the Gabapentin today to help relieve some of the pain. Toradol shot in office today for some immediate relief.   Patient would benefit from physical therapy but is still awaiting health insurance coverage. When she obtains coverage will refer to physical therapy and also consider orthopedic consult if not improving.   PATIENT EDUCATION PROVIDED: See AVS    Diagnosis and plan along with any newly prescribed medication(s) were discussed in detail with this patient today. The patient verbalized understanding and agreed with the plan. Patient advised if symptoms worsen return to clinic or ER.   Health Maintainance:   No orders of the defined types were placed in this encounter.   No orders of the defined types were placed in this encounter.    Harolyn Rutherford, DO 06/17/2018, 9:58 AM PGY-2 [facility]

## 2018-06-17 NOTE — Addendum Note (Signed)
Addended by: Leonia Corona R on: 06/17/2018 11:01 AM   Modules accepted: Orders

## 2018-06-17 NOTE — Patient Instructions (Signed)
It was great to see you today Cheyenne Patel! Thank you for letting me participate in your care once again!   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 Please continue to do your daily exercises and continue to lose weight. These will help you long term. In the short term I will give you a shot of Toradol today to help with the acute flare.  Also, please go by your pharmacy to see if you can pick up Gabapentin which may help with your numbness and tingling. Please use a heating pad and ice packs at home and at night to help with pain. I would also recommend purchasing some orthopedic inserts for your shoes since you are on your feet most of the day.  Once you have insurance coverage please call or message me so I can make the proper referrals for you to physical therapy and possibly orthopedics.  Be well, Harolyn Rutherford, DO PGY-2, [facility]

## 2018-06-17 NOTE — Assessment & Plan Note (Addendum)
Patient is compliant with home exercises and stretches but not seeing improvement in back pain. She will try to see if she can afford the Gabapentin today to help relieve some of the pain. Toradol shot in office today for some immediate relief.   Patient would benefit from physical therapy but is still awaiting health insurance coverage. When she obtains coverage will refer to physical therapy and also consider orthopedic consult if not improving.

## 2019-05-25 ENCOUNTER — Other Ambulatory Visit: Payer: Self-pay

## 2019-05-25 ENCOUNTER — Ambulatory Visit (INDEPENDENT_AMBULATORY_CARE_PROVIDER_SITE_OTHER): Payer: BC Managed Care – PPO | Admitting: Family Medicine

## 2019-05-25 VITALS — BP 122/80 | HR 81

## 2019-05-25 DIAGNOSIS — I159 Secondary hypertension, unspecified: Secondary | ICD-10-CM | POA: Diagnosis not present

## 2019-05-25 DIAGNOSIS — E079 Disorder of thyroid, unspecified: Secondary | ICD-10-CM | POA: Diagnosis not present

## 2019-05-25 DIAGNOSIS — M5386 Other specified dorsopathies, lumbar region: Secondary | ICD-10-CM

## 2019-05-25 DIAGNOSIS — R5383 Other fatigue: Secondary | ICD-10-CM

## 2019-05-25 MED ORDER — AMLODIPINE BESYLATE 10 MG PO TABS: 10.0000 mg | ORAL_TABLET | Freq: Every day | ORAL | 3 refills | Status: AC

## 2019-05-25 MED ORDER — IBUPROFEN 600 MG PO TABS: 600.0000 mg | ORAL_TABLET | Freq: Three times a day (TID) | ORAL | 0 refills | Status: AC | PRN

## 2019-05-25 NOTE — Patient Instructions (Signed)
It was great to see you today! Thank you for letting me participate in your care!  Today, we discussed your fatigue and I am getting some basic labs today to ensure there is no underlying medical condition contributing to your fatigue and lack of energy.  I have refilled your amlodipine, please take it once a day preferably at night.  I have included exercises to do at home for your sciatica and given you a prescription for motrin. If this does not improve your symptoms in 2 months please call me and let me know.    Be well, Harolyn Rutherford, DO PGY-3, [facility]

## 2019-05-25 NOTE — Progress Notes (Signed)
Subjective: Chief Complaint  Patient presents with  . Medication Refill    HPI: Cheyenne Patel is a 58 y.o. presenting to clinic today to discuss the following:  HTN Patient has history of HTN taking amlodipine. Well controlled today and no reported side effects from amlodipine such as lower leg swelling. She denies headache, vision changes, or chest pain.   Hx of Thyroid Disease with Fatigue Patient has a history of "thyroid disease". She was last seen in our clinic by me almost one year ago. I referred her to endocrinology given her complicated past medical history and a low TSH at that time. In that time she unfortunately lost her insurance but was seen by Endocrinology at some point and told her TSH was "normal". I do not see this in Epic. She still has vague fatigue symptoms that are unchanged from last year. Low energy. She denies significant weight gain, sensitivity to the cold, constipation, or neck swelling.  Sciatica Patient endorsing right sided low back and buttock pain that radiates down the back of her right leg with associated numbness and tingling. This is also an ongoing problem that is intermittent but not getting worse. No loss of bowel or bladder function. She is able to ambulate without difficulty and reports no balance issues.  ROS noted in HPI.   Past Medical, Surgical, Social, and Family History Reviewed & Updated per EMR.   Pertinent Historical Findings include:   Social History   Tobacco Use  Smoking Status Current Every Day Smoker  . Packs/day: 0.10  Smokeless Tobacco Never Used    Objective: BP 122/80   Pulse 81   LMP 11/10/2004   SpO2 97%  Vitals and nursing notes reviewed  Physical Exam Gen: Alert and Oriented x 3, NAD HEENT: Normocephalic, atraumatic, PERRLA, EOMI, TM visible with good light reflex, non-swollen, non-erythematous turbinates, non-erythematous pharyngeal mucosa, no exudates Neck: no thyroidmegaly, no nodules palpated, no  LAD CV: RRR, no murmurs, normal S1, S2 split Resp: CTAB, no wheezing, rales, or rhonchi, comfortable work of breathing Abd: non-distended, non-tender, soft, +bs in all four quadrants MSK: No obvious deformity, Greater trocanter non-tender to palpation, full ROM in the hip joint, 5/5 LE strength bilaterally, gross sensation intact. Positive straight leg test on the right, negative on the left, negative FADIR and FABER testing bilaterally Ext: no clubbing, cyanosis, or edema Skin: warm, dry, intact, no rashes  LABS CBC    Component Value Date/Time   WBC 6.1 05/25/2019 1503   WBC 9.5 05/20/2018 0710   RBC 4.85 05/25/2019 1503   RBC 5.54 (H) 05/20/2018 0710   HGB 13.6 05/25/2019 1503   HCT 39.7 05/25/2019 1503   PLT 181 05/25/2019 1503   MCV 82 05/25/2019 1503   MCH 28.0 05/25/2019 1503   MCH 27.4 05/20/2018 0710   MCHC 34.3 05/25/2019 1503   MCHC 31.8 05/20/2018 0710   RDW 13.7 05/25/2019 1503   Iron/TIBC/Ferritin/ %Sat    Component Value Date/Time   IRON 170 (H) 05/25/2019 1503   TIBC 281 05/25/2019 1503   FERRITIN 125 05/25/2019 1503   IRONPCTSAT 60 (H) 05/25/2019 1503   Vit B12 264  Free T3/T4 Free T3 3.4 T4 8.1  Assessment/Plan:  HTN (hypertension) Well controlled on current regimen and non-diabetic so ACEi or ARB not indicated. - cont Amlodipine 10mg  once daily  Sciatica of right side associated with disorder of lumbar spine Continued pain that is unchanged from last visit. Given it is intermittent and not severe  today and not limiting her ADL I will continue to conservative therapy. She does say she has a history of facet joint narrowing which is most likely contributing to her sciatica symptoms. Given known history and no change in symptoms I will defer further imaging for now. - Cont Ibuprofen as needed - Stressed importance of doing home exercises which were provided to her. Slowly increase to doing 4-5 of the listed exercises 2-3 sets per day 5 times per week  as tolerated until symptoms resolve.  - If this is unsuccessful I will refer her to formal physical therapy as the next step.   Thyroid disease Patient will follow up with Endocrinology given she now has insurance. I will repeat Thyroid labs today given that it has been over one year since I have checked those. Unclear if this is contributing to her fatigue. - Mistakenly forgot to order TSH with her free T3/T4 labs - Contact patient and inform her to come in for a lab only visit to draw TSH - T3/T4 were normal. Patient will be contacted by our staff to come in for a lab visit for TSH   PATIENT EDUCATION PROVIDED: See AVS    Diagnosis and plan along with any newly prescribed medication(s) were discussed in detail with this patient today. The patient verbalized understanding and agreed with the plan. Patient advised if symptoms worsen return to clinic or ER.    Orders Placed This Encounter  Procedures  . CBC  . Iron, TIBC and Ferritin Panel  . T3, Free  . Vitamin B12  . T4    Meds ordered this encounter  Medications  . amLODipine (NORVASC) 10 MG tablet    Sig: Take 1 tablet (10 mg total) by mouth daily.    Dispense:  30 tablet    Refill:  3  . ibuprofen (ADVIL) 600 MG tablet    Sig: Take 1 tablet (600 mg total) by mouth every 8 (eight) hours as needed.    Dispense:  30 tablet    Refill:  0    Harolyn Rutherford, DO 05/25/2019, 2:40 PM PGY-3 [place]

## 2019-05-26 LAB — CBC
Hematocrit: 39.7 % (ref 34.0–46.6)
Hemoglobin: 13.6 g/dL (ref 11.1–15.9)
MCH: 28 pg (ref 26.6–33.0)
MCHC: 34.3 g/dL (ref 31.5–35.7)
MCV: 82 fL (ref 79–97)
Platelets: 181 10*3/uL (ref 150–450)
RBC: 4.85 x10E6/uL (ref 3.77–5.28)
RDW: 13.7 % (ref 11.7–15.4)
WBC: 6.1 10*3/uL (ref 3.4–10.8)

## 2019-05-26 LAB — IRON,TIBC AND FERRITIN PANEL
Ferritin: 125 ng/mL (ref 15–150)
Iron Saturation: 60 % — ABNORMAL HIGH (ref 15–55)
Iron: 170 ug/dL — ABNORMAL HIGH (ref 27–159)
Total Iron Binding Capacity: 281 ug/dL (ref 250–450)
UIBC: 111 ug/dL — ABNORMAL LOW (ref 131–425)

## 2019-05-26 LAB — VITAMIN B12: Vitamin B-12: 264 pg/mL (ref 232–1245)

## 2019-05-26 LAB — T4: T4, Total: 8.1 ug/dL (ref 4.5–12.0)

## 2019-05-26 LAB — T3, FREE: T3, Free: 3.4 pg/mL (ref 2.0–4.4)

## 2019-05-30 ENCOUNTER — Encounter: Payer: Self-pay | Admitting: Family Medicine

## 2019-05-30 DIAGNOSIS — R5383 Other fatigue: Secondary | ICD-10-CM | POA: Insufficient documentation

## 2019-05-30 NOTE — Progress Notes (Signed)
Sending letter of normal results to patient

## 2019-05-30 NOTE — Assessment & Plan Note (Signed)
Well controlled on current regimen and non-diabetic so ACEi or ARB not indicated. - cont Amlodipine 10mg  once daily

## 2019-05-30 NOTE — Assessment & Plan Note (Signed)
Patient will follow up with Endocrinology given she now has insurance. I will repeat Thyroid labs today given that it has been over one year since I have checked those. Unclear if this is contributing to her fatigue. - Mistakenly forgot to order TSH with her free T3/T4 labs - Contact patient and inform her to come in for a lab only visit to draw TSH - T3/T4 were normal.

## 2019-05-30 NOTE — Assessment & Plan Note (Signed)
Continued pain that is unchanged from last visit. Given it is intermittent and not severe today and not limiting her ADL I will continue to conservative therapy. She does say she has a history of facet joint narrowing which is most likely contributing to her sciatica symptoms. Given known history and no change in symptoms I will defer further imaging for now. - Cont Ibuprofen as needed - Stressed importance of doing home exercises which were provided to her. Slowly increase to doing 4-5 of the listed exercises 2-3 sets per day 5 times per week as tolerated until symptoms resolve.  - If this is unsuccessful I will refer her to formal physical therapy as the next step.

## 2019-06-01 ENCOUNTER — Other Ambulatory Visit (HOSPITAL_COMMUNITY)
Admission: RE | Admit: 2019-06-01 | Discharge: 2019-06-01 | Disposition: A | Discharge status: Home / Self Care | Payer: BC Managed Care – PPO | Source: Ambulatory Visit | Attending: Family Medicine | Admitting: Family Medicine

## 2019-06-01 ENCOUNTER — Ambulatory Visit: Payer: BC Managed Care – PPO | Admitting: Family Medicine

## 2019-06-01 ENCOUNTER — Other Ambulatory Visit: Payer: Self-pay

## 2019-06-01 ENCOUNTER — Encounter: Payer: Self-pay | Admitting: Family Medicine

## 2019-06-01 VITALS — BP 104/64 | HR 78

## 2019-06-01 DIAGNOSIS — Z124 Encounter for screening for malignant neoplasm of cervix: Secondary | ICD-10-CM | POA: Insufficient documentation

## 2019-06-01 DIAGNOSIS — E079 Disorder of thyroid, unspecified: Secondary | ICD-10-CM | POA: Diagnosis not present

## 2019-06-01 DIAGNOSIS — Z0000 Encounter for general adult medical examination without abnormal findings: Secondary | ICD-10-CM | POA: Diagnosis not present

## 2019-06-01 NOTE — Patient Instructions (Signed)
It was great to see you today! Thank you for letting me participate in your care!  Today, we discussed we performed the recommend pap smear. I will call you if results are abnormal.   I have also set you up for a mammogram. Please have it done and I will call you with results.  Keep up the exercises for the sciatica as you can tolerate. Come back and see me in 6-8 weeks if no improvement.  Be well, Harolyn Rutherford, DO PGY-3, [facility]

## 2019-06-01 NOTE — Progress Notes (Signed)
Subjective: Chief Complaint  Patient presents with  . Gynecologic Exam     HPI: Cheyenne Patel is a 58 y.o. presenting to clinic today to discuss the following:  Papsmear Patient is overdue for a routine papsmear. She has no symptoms and no complaints. She presents today to have her pap. No vaginal or pelvic pain. No discharge, rash, or itching in the vaginal area.   Denies fever, chills, weight loss, SOB, abdominal pain, nausea, vomiting, diarrhea, or constipation     ROS noted in HPI.   Past Medical, Surgical, Social, and Family History Reviewed & Updated per EMR.   Pertinent Historical Findings include:   Social History   Tobacco Use  Smoking Status Current Every Day Smoker  . Packs/day: 0.10  Smokeless Tobacco Never Used    Objective: BP 104/64   Pulse 78   LMP 11/10/2004   SpO2 97%  Vitals and nursing notes reviewed  Physical Exam Gen: Alert and Oriented x 3, NAD CV: RRR, no murmurs, normal S1, S2 split Resp: CTAB, no wheezing, rales, or rhonchi, comfortable work of breathing GU: no rashes or lesions on the labia majora or minor, normal appearing vaginal mucosa, no discharge in vaginal vault, normal appearing cervix Ext: no clubbing, cyanosis, or edema Skin: warm, dry, intact, no rashes  Results for orders placed or performed in visit on 06/01/19 (from the past 72 hour(s))  Cytology - PAP([facility])     Status: None   Collection Time: 06/01/19 12:00 AM  Result Value Ref Range   Adequacy      Satisfactory for evaluation  endocervical/transformation zone component ABSENT.   Diagnosis      NEGATIVE FOR INTRAEPITHELIAL LESIONS OR MALIGNANCY.   Diagnosis SHIFT IN FLORA SUGGESTIVE OF BACTERIAL VAGINOSIS.    HPV NOT Detected     Comment: Normal Reference Range - NOT Detected   Material Submitted CervicoVaginal Pap [ThinPrep Imaged]   TSH     Status: Abnormal   Collection Time: 06/01/19  4:30 PM  Result Value Ref Range   TSH 0.090 (L) 0.450 - 4.500  uIU/mL    Assessment/Plan:  Healthcare maintenance Routine papsmear due and performed today without complication. - Cytology normal, will send results to patient  Thyroid disease Low TSH with normal T3/T4 - Subclinical hypothyroidism, patient is following with endocrine - Cont to follow with yearly TSH and T3/T4 checks  PATIENT EDUCATION PROVIDED: See AVS    Diagnosis and plan along with any newly prescribed medication(s) were discussed in detail with this patient today. The patient verbalized understanding and agreed with the plan. Patient advised if symptoms worsen return to clinic or ER.   Health Maintainance: mammogram ordered   Orders Placed This Encounter  Procedures  . MM Digital Screening    Epic order Ins-bcbs WR:UEAVW one 2D No problems/ no hx/ no implants or reductions/ no needs/ fwp and pt    Standing Status:   Future    Standing Expiration Date:   07/02/2019    Order Specific Question:   Reason for Exam (SYMPTOM  OR DIAGNOSIS REQUIRED)    Answer:   hralthcare maintnenance    Order Specific Question:   Is the patient pregnant?    Answer:   No    Order Specific Question:   Preferred imaging location?    Answer:   [facility]    No orders of the defined types were placed in this encounter.    Harolyn Rutherford, DO 06/01/2019, 4:16 PM PGY-3 [facility]

## 2019-06-02 LAB — TSH: TSH: 0.09 u[IU]/mL — ABNORMAL LOW (ref 0.450–4.500)

## 2019-06-03 ENCOUNTER — Other Ambulatory Visit: Payer: Self-pay | Admitting: Family Medicine

## 2019-06-03 DIAGNOSIS — Z0000 Encounter for general adult medical examination without abnormal findings: Secondary | ICD-10-CM | POA: Insufficient documentation

## 2019-06-03 LAB — CYTOLOGY - PAP
Adequacy: ABSENT
Diagnosis: NEGATIVE
HPV: NOT DETECTED

## 2019-06-03 NOTE — Assessment & Plan Note (Signed)
Low TSH with normal T3/T4 - Subclinical hypothyroidism, patient is following with endocrine - Cont to follow with yearly TSH and T3/T4 checks

## 2019-06-03 NOTE — Assessment & Plan Note (Signed)
Routine papsmear due and performed today without complication. - Cytology normal, will send results to patient

## 2019-06-05 ENCOUNTER — Encounter: Payer: Self-pay | Admitting: Family Medicine

## 2019-06-06 ENCOUNTER — Encounter: Payer: Self-pay | Admitting: Family Medicine

## 2019-06-08 ENCOUNTER — Ambulatory Visit
Admission: RE | Admit: 2019-06-08 | Discharge: 2019-06-08 | Disposition: A | Discharge status: Home / Self Care | Payer: BC Managed Care – PPO | Source: Ambulatory Visit | Attending: Family Medicine | Admitting: Family Medicine

## 2019-06-08 ENCOUNTER — Encounter: Payer: Self-pay | Admitting: Family Medicine

## 2019-06-08 ENCOUNTER — Other Ambulatory Visit: Payer: Self-pay

## 2019-06-08 DIAGNOSIS — Z0000 Encounter for general adult medical examination without abnormal findings: Secondary | ICD-10-CM

## 2019-06-09 ENCOUNTER — Other Ambulatory Visit: Payer: Self-pay | Admitting: Family Medicine

## 2019-06-09 ENCOUNTER — Encounter: Payer: Self-pay | Admitting: Family Medicine

## 2019-06-09 DIAGNOSIS — R928 Other abnormal and inconclusive findings on diagnostic imaging of breast: Secondary | ICD-10-CM

## 2019-06-10 ENCOUNTER — Telehealth: Payer: BC Managed Care – PPO | Admitting: Family Medicine

## 2019-06-10 ENCOUNTER — Other Ambulatory Visit: Payer: Self-pay

## 2019-06-10 ENCOUNTER — Other Ambulatory Visit: Payer: Self-pay | Admitting: Family Medicine

## 2019-06-10 ENCOUNTER — Encounter: Payer: Self-pay | Admitting: Family Medicine

## 2019-06-10 MED ORDER — METRONIDAZOLE 500 MG PO TABS: 500.0000 mg | ORAL_TABLET | Freq: Two times a day (BID) | ORAL | 0 refills | Status: AC

## 2019-06-10 NOTE — Progress Notes (Signed)
Patient had pap and had "findings consistent with BV" listed on results. At the time of the exam, patient endorsed no symptoms, no odor, and no discharge. Today via MyChart she says she has no odor but some discharge. Will treat with metronidazole 500mg  BID for 7 days.

## 2019-06-10 NOTE — Progress Notes (Signed)
Patient had question about her Pap smear test results.  Originally appointment was set up to discuss these.  However her PCP, Dr. Garlan Fillers, already message patient about her test results.  Patient had no other questions.

## 2019-06-15 ENCOUNTER — Other Ambulatory Visit: Payer: BC Managed Care – PPO

## 2019-06-15 ENCOUNTER — Ambulatory Visit
Admission: RE | Admit: 2019-06-15 | Discharge: 2019-06-15 | Disposition: A | Discharge status: Home / Self Care | Payer: Self-pay | Source: Ambulatory Visit | Attending: Family Medicine | Admitting: Family Medicine

## 2019-06-15 ENCOUNTER — Other Ambulatory Visit: Payer: Self-pay | Admitting: Family Medicine

## 2019-06-15 ENCOUNTER — Ambulatory Visit
Admission: RE | Admit: 2019-06-15 | Discharge: 2019-06-15 | Disposition: A | Discharge status: Home / Self Care | Payer: BC Managed Care – PPO | Source: Ambulatory Visit | Attending: Family Medicine | Admitting: Family Medicine

## 2019-06-15 ENCOUNTER — Other Ambulatory Visit: Payer: Self-pay

## 2019-06-15 DIAGNOSIS — R928 Other abnormal and inconclusive findings on diagnostic imaging of breast: Secondary | ICD-10-CM

## 2019-06-15 DIAGNOSIS — E079 Disorder of thyroid, unspecified: Secondary | ICD-10-CM

## 2019-06-15 NOTE — Progress Notes (Signed)
Repeat thyroid labs as last TSH was low but normal free T3 and T4

## 2019-06-16 LAB — T4, FREE: Free T4: 1.16 ng/dL (ref 0.82–1.77)

## 2019-06-16 LAB — T3, FREE: T3, Free: 3 pg/mL (ref 2.0–4.4)

## 2019-06-16 LAB — TSH: TSH: 0.177 u[IU]/mL — ABNORMAL LOW (ref 0.450–4.500)

## 2019-06-22 ENCOUNTER — Encounter: Payer: Self-pay | Admitting: Family Medicine

## 2019-06-22 NOTE — Progress Notes (Signed)
Letter to patient informing her of test results. Repeat in 4 weeks as T3/T4 were normal.   She does have a referral to endocrine as she has a history of thyroid disease

## 2019-07-26 ENCOUNTER — Encounter: Payer: Self-pay | Admitting: Family Medicine

## 2019-07-27 ENCOUNTER — Encounter: Payer: Self-pay | Admitting: Family Medicine

## 2019-07-27 ENCOUNTER — Other Ambulatory Visit: Payer: Self-pay | Admitting: Family Medicine

## 2019-07-27 DIAGNOSIS — Z0000 Encounter for general adult medical examination without abnormal findings: Secondary | ICD-10-CM

## 2019-07-27 NOTE — Progress Notes (Signed)
Patient sent message via Epic requesting I help schedule her for a colonoscopy. Putting in order for referral.

## 2019-07-29 ENCOUNTER — Encounter: Payer: Self-pay | Admitting: Gastroenterology

## 2019-08-22 ENCOUNTER — Ambulatory Visit (AMBULATORY_SURGERY_CENTER): Payer: Self-pay | Admitting: *Deleted

## 2019-08-22 ENCOUNTER — Other Ambulatory Visit: Payer: Self-pay

## 2019-08-22 VITALS — Temp 97.3°F | Ht 65.0 in | Wt 199.0 lb

## 2019-08-22 DIAGNOSIS — Z1211 Encounter for screening for malignant neoplasm of colon: Secondary | ICD-10-CM

## 2019-08-22 MED ORDER — SUPREP BOWEL PREP KIT 17.5-3.13-1.6 GM/177ML PO SOLN: 1.0000 | Freq: Once | ORAL | 0 refills | Status: AC

## 2019-08-22 NOTE — Progress Notes (Signed)
No egg or soy allergy known to patient  No issues with past sedation with any surgeries  or procedures, no intubation problems  No diet pills per patient No home 02 use per patient  No blood thinners per patient  Pt denies issues with constipation  No A fib or A flutter  EMMI video sent to pt's e mail  Suprep $15 coupon  Pt declined COVID testing as [facility]  Due to the COVID-19 pandemic we are asking patients to follow these guidelines. Please only bring one care partner. Please be aware that your care partner may wait in the car in the parking lot or if they feel like they will be too hot to wait in the car, they may wait in the lobby on the 4th floor. All care partners are required to wear a mask the entire time (we do not have any that we can provide them), they need to practice social distancing, and we will do a Covid check for all patient's and care partners when you arrive. Also we will check their temperature and your temperature. If the care partner waits in their car they need to stay in the parking lot the entire time and we will call them on their cell phone when the patient is ready for discharge so they can bring the car to the front of the building. Also all patient's will need to wear a mask into building.

## 2019-08-23 ENCOUNTER — Encounter: Payer: Self-pay | Admitting: Gastroenterology

## 2019-09-05 ENCOUNTER — Encounter: Payer: Self-pay | Admitting: Gastroenterology

## 2019-09-05 ENCOUNTER — Other Ambulatory Visit: Payer: Self-pay

## 2019-09-05 ENCOUNTER — Ambulatory Visit (AMBULATORY_SURGERY_CENTER): Payer: BC Managed Care – PPO | Admitting: Gastroenterology

## 2019-09-05 VITALS — BP 118/88 | HR 68 | Temp 98.4°F | Resp 20 | Ht 65.0 in | Wt 199.0 lb

## 2019-09-05 DIAGNOSIS — D125 Benign neoplasm of sigmoid colon: Secondary | ICD-10-CM

## 2019-09-05 DIAGNOSIS — D123 Benign neoplasm of transverse colon: Secondary | ICD-10-CM

## 2019-09-05 DIAGNOSIS — D120 Benign neoplasm of cecum: Secondary | ICD-10-CM

## 2019-09-05 DIAGNOSIS — Z1211 Encounter for screening for malignant neoplasm of colon: Secondary | ICD-10-CM

## 2019-09-05 MED ORDER — SODIUM CHLORIDE 0.9 % IV SOLN: 500.0000 mL | Freq: Once | INTRAVENOUS | Status: DC

## 2019-09-05 NOTE — Progress Notes (Signed)
Called to room to assist during endoscopic procedure.  Patient ID and intended procedure confirmed with present staff. Received instructions for my participation in the procedure from the performing physician.  

## 2019-09-05 NOTE — Patient Instructions (Signed)
Thank you for allowing us to care for you today!  Await pathology results by mail, approximately 2 weeks.  Will make recommendation for next colonoscopy at that time.  Resume previous diet and medications today.  Return to your normal activities tomorrow.   YOU HAD AN ENDOSCOPIC PROCEDURE TODAY AT THE [facility]:   Refer to the procedure report that was given to you for any specific questions about what was found during the examination.  If the procedure report does not answer your questions, please call your gastroenterologist to clarify.  If you requested that your care partner not be given the details of your procedure findings, then the procedure report has been included in a sealed envelope for you to review at your convenience later.  YOU SHOULD EXPECT: Some feelings of bloating in the abdomen. Passage of more gas than usual.  Walking can help get rid of the air that was put into your GI tract during the procedure and reduce the bloating. If you had a lower endoscopy (such as a colonoscopy or flexible sigmoidoscopy) you may notice spotting of blood in your stool or on the toilet paper. If you underwent a bowel prep for your procedure, you may not have a normal bowel movement for a few days.  Please Note:  You might notice some irritation and congestion in your nose or some drainage.  This is from the oxygen used during your procedure.  There is no need for concern and it should clear up in a day or so.  SYMPTOMS TO REPORT IMMEDIATELY:   Following lower endoscopy (colonoscopy or flexible sigmoidoscopy):  Excessive amounts of blood in the stool  Significant tenderness or worsening of abdominal pains  Swelling of the abdomen that is new, acute  Fever of 100F or higher  For urgent or emergent issues, a gastroenterologist can be reached at any hour by calling [phone].   DIET:  We do recommend a small meal at first, but then you may proceed to your regular diet.  Drink  plenty of fluids but you should avoid alcoholic beverages for 24 hours.  ACTIVITY:  You should plan to take it easy for the rest of today and you should NOT DRIVE or use heavy machinery until tomorrow (because of the sedation medicines used during the test).    FOLLOW UP: Our staff will call the number listed on your records 48-72 hours following your procedure to check on you and address any questions or concerns that you may have regarding the information given to you following your procedure. If we do not reach you, we will leave a message.  We will attempt to reach you two times.  During this call, we will ask if you have developed any symptoms of COVID 19. If you develop any symptoms (ie: fever, flu-like symptoms, shortness of breath, cough etc.) before then, please call [phone].  If you test positive for Covid 19 in the 2 weeks post procedure, please call and report this information to us.    If any biopsies were taken you will be contacted by phone or by letter within the next 1-3 weeks.  Please call us at [phone] if you have not heard about the biopsies in 3 weeks.    SIGNATURES/CONFIDENTIALITY: You and/or your care partner have signed paperwork which will be entered into your electronic medical record.  These signatures attest to the fact that that the information above on your After Visit Summary has been reviewed and is   reviewed and is understood.  Full responsibility of the confidentiality of this discharge information lies with you and/or your care-partner. 

## 2019-09-05 NOTE — Progress Notes (Signed)
Pt's states no medical or surgical changes since previsit or office visit. 

## 2019-09-05 NOTE — Op Note (Signed)
[place] Patient Name: Cheyenne Patel Procedure Date: 09/05/2019 11:07 AM MRN: [id number] Endoscopist: Remo Lipps P. Havery Moros , MD Age: 58 Referring MD:  Date of Birth: 11/30/1960 Gender: Female Account #: [id number] Procedure:                Colonoscopy Indications:              Screening for colorectal malignant neoplasm, This                            is the patient's first colonoscopy Medicines:                Monitored Anesthesia Care Procedure:                Pre-Anesthesia Assessment:                           - Prior to the procedure, a History and Physical                            was performed, and patient medications and                            allergies were reviewed. The patient's tolerance of                            previous anesthesia was also reviewed. The risks                            and benefits of the procedure and the sedation                            options and risks were discussed with the patient.                            All questions were answered, and informed consent                            was obtained. Prior Anticoagulants: The patient has                            taken no previous anticoagulant or antiplatelet                            agents. ASA Grade Assessment: II - A patient with                            mild systemic disease. After reviewing the risks                            and benefits, the patient was deemed in                            satisfactory condition to undergo the procedure.  After obtaining informed consent, the colonoscope                            was passed under direct vision. Throughout the                            procedure, the patient's blood pressure, pulse, and                            oxygen saturations were monitored continuously. The                            Colonoscope was introduced through the anus and                            advanced to the  the cecum, identified by                            appendiceal orifice and ileocecal valve. The                            colonoscopy was performed without difficulty. The                            patient tolerated the procedure well. The quality                            of the bowel preparation was adequate. The                            ileocecal valve, appendiceal orifice, and rectum                            were photographed. Scope In: 11:19:15 AM Scope Out: 11:40:37 AM Scope Withdrawal Time: 0 hours 14 minutes 33 seconds  Total Procedure Duration: 0 hours 21 minutes 22 seconds  Findings:                 The perianal and digital rectal examinations were                            normal.                           Two sessile polyps were found in the cecum. The                            polyps were 3 to 6 mm in size. These polyps were                            removed with a cold snare. Resection and retrieval                            were complete.  Five sessile polyps were found in the transverse                            colon. The polyps were 3 to 7 mm in size. These                            polyps were removed with a cold snare. Resection                            and retrieval were complete.                           Two sessile polyps were found in the sigmoid colon.                            The polyps were 3 mm in size. These polyps were                            removed with a cold snare. Resection and retrieval                            were complete.                           Internal hemorrhoids were found during retroflexion.                           The exam was otherwise without abnormality. Complications:            No immediate complications. Estimated blood loss:                            Minimal. Estimated Blood Loss:     Estimated blood loss was minimal. Impression:               - Two 3 to 6 mm polyps in the cecum,  removed with a                            cold snare. Resected and retrieved.                           - Five 3 to 7 mm polyps in the transverse colon,                            removed with a cold snare. Resected and retrieved.                           - Two 3 mm polyps in the sigmoid colon, removed                            with a cold snare. Resected and retrieved.                           -  Internal hemorrhoids.                           - The examination was otherwise normal. Recommendation:           - Patient has a contact number available for                            emergencies. The signs and symptoms of potential                            delayed complications were discussed with the                            patient. Return to normal activities tomorrow.                            Written discharge instructions were provided to the                            patient.                           - Resume previous diet.                           - Continue present medications.                           - Await pathology results. Remo Lipps P. Terrel Nesheiwat, MD 09/05/2019 11:45:25 AM This report has been signed electronically.

## 2019-09-05 NOTE — Progress Notes (Signed)
PT taken to PACU. Monitors in place. VSS. Report given to RN. 

## 2019-09-07 ENCOUNTER — Telehealth: Payer: Self-pay

## 2019-09-07 NOTE — Telephone Encounter (Signed)
  Follow up Call-  Call back number 09/05/2019  Post procedure Call Back phone  # [phone]  Permission to leave phone message Yes     Patient questions:  Do you have a fever, pain , or abdominal swelling? No. Pain Score  0 *  Have you tolerated food without any problems? Yes.    Have you been able to return to your normal activities? Yes.    Do you have any questions about your discharge instructions: Diet   No. Medications  No. Follow up visit  No.  Do you have questions or concerns about your Care? No.  Actions: * If pain score is 4 or above: No action needed, pain <4.  Covid-19 screening questions   Do you now or have you had a fever in the last 14 days?NO.  Do you have any respiratory symptoms of shortness of breath or cough now or in the last 14 days?No.  Do you have any family members or close contacts with diagnosed or suspected Covid-19 in the past 14 days?No.  Have you been tested for Covid-19 and found to be positive?No.

## 2019-11-14 ENCOUNTER — Encounter: Payer: Self-pay | Admitting: Family Medicine

## 2019-11-15 ENCOUNTER — Telehealth: Payer: Self-pay

## 2019-11-15 ENCOUNTER — Encounter: Payer: Self-pay | Admitting: Family Medicine

## 2019-11-15 ENCOUNTER — Other Ambulatory Visit: Payer: Self-pay | Admitting: Family Medicine

## 2019-11-15 MED ORDER — LIDO-CAPSAICIN-MEN-METHYL SAL 0.5-0.035-5-20 % EX PTCH: 1.0000 | MEDICATED_PATCH | Freq: Three times a day (TID) | CUTANEOUS | 1 refills | Status: AC | PRN

## 2019-11-15 NOTE — Progress Notes (Signed)
Patient still having some low back pain related to sciatica and requested some patches to try and help. Sending in some Lidocaine patches.

## 2019-11-15 NOTE — Telephone Encounter (Signed)
Pharmacy calls nurse line stating the lidocaine patches called in are not available for order. Pharmacist is asking to change to the "normal" Lidocaine patch. If ok I can call them tomorrow to give verbal for this. Otherwise, patient will have to obtain her own OTC.

## 2019-11-16 ENCOUNTER — Telehealth: Payer: Self-pay | Admitting: *Deleted

## 2019-11-16 ENCOUNTER — Encounter: Payer: Self-pay | Admitting: Family Medicine

## 2019-11-16 NOTE — Telephone Encounter (Signed)
Received fax from pharmacy, PA needed on lidocaine patch 5%.  Clinical questions submitted via Cover My Meds.  Waiting on response, could take up to 72 hours.  Cover My Meds info: Key: [id number]  Christen Bame, CMA

## 2019-11-16 NOTE — Telephone Encounter (Signed)
Spoke with Pharmacist.  Lidocaine 5% #30 patches verbally given.

## 2019-11-16 NOTE — Telephone Encounter (Signed)
Med was denied by insurance.  See below:

## 2019-11-22 NOTE — Telephone Encounter (Signed)
Opened in error.  Christen Bame, CMA

## 2019-11-23 ENCOUNTER — Encounter: Payer: Self-pay | Admitting: Family Medicine

## 2019-12-19 ENCOUNTER — Other Ambulatory Visit: Payer: Self-pay

## 2019-12-19 ENCOUNTER — Ambulatory Visit: Payer: BC Managed Care – PPO | Admitting: Family Medicine

## 2019-12-19 ENCOUNTER — Encounter: Payer: Self-pay | Admitting: Family Medicine

## 2019-12-19 VITALS — BP 98/62 | HR 88 | Wt 188.4 lb

## 2019-12-19 DIAGNOSIS — M5386 Other specified dorsopathies, lumbar region: Secondary | ICD-10-CM | POA: Diagnosis not present

## 2019-12-19 MED ORDER — TIZANIDINE HCL 4 MG PO TABS: 4.0000 mg | ORAL_TABLET | Freq: Four times a day (QID) | ORAL | 0 refills | Status: AC | PRN

## 2019-12-19 NOTE — Assessment & Plan Note (Signed)
Patient with worsening symptoms with > 6 months duration. She has been doing home exercises, Ibuprofen, Tylenol, and using Icy Hot with minimal relief - MRI Lumbar spine - Patient works with no time for formal PT - Cont home exercises, Tylenol and Ibuprofen as needed - Can also use Capciasin cream, Voltaren gel OTC PRN - Tizanidine QID PRN - F/u with me after MRI; depending on results will refer to orthopedics as patient is willing to have corticosteroid injections and/or surgery if indicated

## 2019-12-19 NOTE — Patient Instructions (Signed)
It was great to see you today! Thank you for letting me participate in your care!  Today, we discussed your continued back pain. I have ordered an MRI of your lower back to further assess the cause of your sciatica symptoms. In the meantime, please take the tizanidine as prescribed. Be sure to take it at night time at first as it can make you drowsy.   You can also use OTC Capsaicin cream and Voltaren gel as needed. You can continue with your home stretches and your Tylenol and Ibuprofen as needed.  Be well, Harolyn Rutherford, DO PGY-3, [facility]

## 2019-12-19 NOTE — Progress Notes (Signed)
   CHIEF COMPLAINT / HPI: Low Back Pain on the right radiating down the right buttock and leg  Right Sides Low Back Pain w/Sciatica Patient presents today for follow up for right sided low back pain with sciatica. She states now she is waking up with low back pain every day with radiation of the pain and numbness occurring frequently with pain rated at a 8/10. Lumbar x-rays reviewed and showed some mild degenerative changes. She states the pain and numbness radiates all the way down to her right foot. She   PERTINENT  PMH / PSH: Low back pain with sciatica, HTN, Hypothyroidism   OBJECTIVE: BP 98/62   Pulse 88   Wt 188 lb 6.4 oz (85.5 kg)   LMP 11/10/2004   SpO2 96%   BMI 31.35 kg/m   Lumbar spine: - Inspection: no gross deformity or asymmetry, swelling or ecchymosis. No skin changes - Palpation: No TTP over the spinous processes, TTP along the right lumbar paraspinal muscles, none at the SI joints b/l - ROM: full active ROM of the lumbar spine in flexion and extension without pain - Strength: 5/5 strength of lower extremity in L4-S1 nerve root distributions b/l - Neuro: sensation intact in the L4-S1 nerve root distribution b/l, 2+ L4 and S1 reflexes - Special testing: Positive straight leg raise on the right, negative on the left  ASSESSMENT / PLAN:  Sciatica of right side associated with disorder of lumbar spine Patient with worsening symptoms with > 6 months duration. She has been doing home exercises, Ibuprofen, Tylenol, and using Icy Hot with minimal relief - MRI Lumbar spine - Patient works with no time for formal PT - Cont home exercises, Tylenol and Ibuprofen as needed - Can also use Capciasin cream, Voltaren gel OTC PRN - Tizanidine QID PRN - F/u with me after MRI; depending on results will refer to orthopedics as patient is willing to have corticosteroid injections and/or surgery if indicated    Nuala Alpha, DO, PGY-3 [place] 12/19/2019

## 2020-01-04 ENCOUNTER — Ambulatory Visit
Admission: RE | Admit: 2020-01-04 | Discharge: 2020-01-04 | Disposition: A | Discharge status: Home / Self Care | Payer: BC Managed Care – PPO | Source: Ambulatory Visit | Attending: Family Medicine | Admitting: Family Medicine

## 2020-01-04 ENCOUNTER — Other Ambulatory Visit: Payer: Self-pay

## 2020-01-04 DIAGNOSIS — M5386 Other specified dorsopathies, lumbar region: Secondary | ICD-10-CM

## 2020-01-05 ENCOUNTER — Encounter: Payer: Self-pay | Admitting: Family Medicine

## 2020-01-05 ENCOUNTER — Ambulatory Visit: Payer: BC Managed Care – PPO | Attending: Family

## 2020-01-05 DIAGNOSIS — Z23 Encounter for immunization: Secondary | ICD-10-CM | POA: Insufficient documentation

## 2020-01-05 NOTE — Progress Notes (Signed)
   Covid-19 Vaccination Clinic  Name:  Cheyenne Patel    MRN: [id number] DOB: 1960/12/15  01/05/2020  Cheyenne Patel was observed post Covid-19 immunization for 15 minutes without incidence. She was provided with Vaccine Information Sheet and instruction to access the V-Safe system.   Cheyenne Patel was instructed to call 911 with any severe reactions post vaccine: Marland Kitchen Difficulty breathing  . Swelling of your face and throat  . A fast heartbeat  . A bad rash all over your body  . Dizziness and weakness    Immunizations Administered    Name Date Dose VIS Date Route   Moderna COVID-19 Vaccine 01/05/2020  2:05 PM 0.5 mL 10/11/2019 Intramuscular   Manufacturer: Moderna   LotKQ:[id number]   [id number]

## 2020-01-13 ENCOUNTER — Encounter: Payer: Self-pay | Admitting: Family Medicine

## 2020-02-07 ENCOUNTER — Ambulatory Visit: Payer: BC Managed Care – PPO | Attending: Family

## 2020-02-07 DIAGNOSIS — Z23 Encounter for immunization: Secondary | ICD-10-CM

## 2020-02-07 NOTE — Progress Notes (Signed)
   Covid-19 Vaccination Clinic  Name:  Cheyenne Patel    MRN: [id number] DOB: 03-Jun-1961  02/07/2020  [name] was observed post Covid-19 immunization for 15 minutes without incident. She was provided with Vaccine Information Sheet and instruction to access the V-Safe system.   [name] was instructed to call 911 with any severe reactions post vaccine: Marland Kitchen Difficulty breathing  . Swelling of face and throat  . A fast heartbeat  . A bad rash all over body  . Dizziness and weakness   Immunizations Administered    Name Date Dose VIS Date Route   Moderna COVID-19 Vaccine 02/07/2020 12:02 PM 0.5 mL 10/11/2019 Intramuscular   Manufacturer: Moderna   [id number]   [place][id number]

## 2020-07-11 NOTE — Telephone Encounter (Signed)
Please call patient and have her schedule an appointment.  She will also need the results of her recent thyroid testing as well as any documentation that was done. Please have her complete the request for medical information before the visit.  Thank you  Carollee Leitz, MD [facility]

## 2020-07-20 ENCOUNTER — Encounter (INDEPENDENT_AMBULATORY_CARE_PROVIDER_SITE_OTHER): Payer: Self-pay

## 2020-07-20 ENCOUNTER — Encounter: Payer: Self-pay | Admitting: Family Medicine

## 2020-08-01 ENCOUNTER — Encounter: Payer: Self-pay | Admitting: Family Medicine

## 2020-08-01 ENCOUNTER — Ambulatory Visit (INDEPENDENT_AMBULATORY_CARE_PROVIDER_SITE_OTHER): Payer: BC Managed Care – PPO | Admitting: Family Medicine

## 2020-08-01 ENCOUNTER — Other Ambulatory Visit: Payer: Self-pay

## 2020-08-01 VITALS — BP 106/80 | HR 86 | Ht 65.0 in | Wt 165.2 lb

## 2020-08-01 DIAGNOSIS — Z8619 Personal history of other infectious and parasitic diseases: Secondary | ICD-10-CM

## 2020-08-01 DIAGNOSIS — E079 Disorder of thyroid, unspecified: Secondary | ICD-10-CM | POA: Diagnosis not present

## 2020-08-01 DIAGNOSIS — I159 Secondary hypertension, unspecified: Secondary | ICD-10-CM

## 2020-08-01 DIAGNOSIS — R5383 Other fatigue: Secondary | ICD-10-CM

## 2020-08-01 NOTE — Patient Instructions (Addendum)
Thank you for coming to see me today. It was a pleasure.    I will call you with your blood results.  Please follow-up with PCP as needed  If you have any questions or concerns, please do not hesitate to call the office at [phone].  Best,   Carollee Leitz, MD [facility]    Hyperthyroidism  Hyperthyroidism is when the thyroid gland is too active (overactive). The thyroid gland is a small gland located in the lower front part of the neck, just in front of the windpipe (trachea). This gland makes hormones that help control how the body uses food for energy (metabolism) as well as how the heart and brain function. These hormones also play a role in keeping your bones strong. When the thyroid is overactive, it produces too much of a hormone called thyroxine. What are the causes? This condition may be caused by:  Graves' disease. This is a disorder in which the body's disease-fighting system (immune system) attacks the thyroid gland. This is the most common cause.  Inflammation of the thyroid gland.  A tumor in the thyroid gland.  Use of certain medicines, including: ? Prescription thyroid hormone replacement. ? Herbal supplements that mimic thyroid hormones. ? Amiodarone therapy.  Solid or fluid-filled lumps within your thyroid gland (thyroid nodules).  Taking in a large amount of iodine from foods or medicines. What increases the risk? You are more likely to develop this condition if:  You are female.  You have a family history of thyroid conditions.  You smoke tobacco.  You use a medicine called lithium.  You take medicines that affect the immune system (immunosuppressants). What are the signs or symptoms? Symptoms of this condition include:  Nervousness.  Inability to tolerate heat.  Unexplained weight loss.  Diarrhea.  Change in the texture of hair or skin.  Heart skipping beats or making extra beats.  Rapid heart rate.  Loss of  menstruation.  Shaky hands.  Fatigue.  Restlessness.  Sleep problems.  Enlarged thyroid gland or a lump in the thyroid (nodule). You may also have symptoms of Graves' disease, which may include:  Protruding eyes.  Dry eyes.  Red or swollen eyes.  Problems with vision. How is this diagnosed? This condition may be diagnosed based on:  Your symptoms and medical history.  A physical exam.  Blood tests.  Thyroid ultrasound. This test involves using sound waves to produce images of the thyroid gland.  A thyroid scan. A radioactive substance is injected into a vein, and images show how much iodine is present in the thyroid.  Radioactive iodine uptake test (RAIU). A small amount of radioactive iodine is given by mouth to see how much iodine the thyroid absorbs after a certain amount of time. How is this treated? Treatment depends on the cause and severity of the condition. Treatment may include:  Medicines to reduce the amount of thyroid hormone your body makes.  Radioactive iodine treatment (radioiodine therapy). This involves swallowing a small dose of radioactive iodine, in capsule or liquid form, to kill thyroid cells.  Surgery to remove part or all of your thyroid gland. You may need to take thyroid hormone replacement medicine for the rest of your life after thyroid surgery.  Medicines to help manage your symptoms. Follow these instructions at home:   Take over-the-counter and prescription medicines only as told by your health care provider.  Do not use any products that contain nicotine or tobacco, such as cigarettes and e-cigarettes.  If you need help quitting, ask your health care provider.  Follow any instructions from your health care provider about diet. You may be instructed to limit foods that contain iodine.  Keep all follow-up visits as told by your health care provider. This is important. ? You will need to have blood tests regularly so that your health  care provider can monitor your condition. Contact a health care provider if:  Your symptoms do not get better with treatment.  You have a fever.  You are taking thyroid hormone replacement medicine and you: ? Have symptoms of depression. ? Feel like you are tired all the time. ? Gain weight. Get help right away if:  You have chest pain.  You have decreased alertness or a change in your awareness.  You have abdominal pain.  You feel dizzy.  You have a rapid heartbeat.  You have an irregular heartbeat.  You have difficulty breathing. Summary  The thyroid gland is a small gland located in the lower front part of the neck, just in front of the windpipe (trachea).  Hyperthyroidism is when the thyroid gland is too active (overactive) and produces too much of a hormone called thyroxine.  The most common cause is Graves' disease, a disorder in which your immune system attacks the thyroid gland.  Hyperthyroidism can cause various symptoms, such as unexplained weight loss, nervousness, inability to tolerate heat, or changes in your heartbeat.  Treatment may include medicine to reduce the amount of thyroid hormone your body makes, radioiodine therapy, surgery, or medicines to manage symptoms. This information is not intended to replace advice given to you by your health care provider. Make sure you discuss any questions you have with your health care provider. Document Revised: 10/09/2017 Document Reviewed: 10/07/2017 Elsevier Patient Education  2020 [employer].

## 2020-08-03 ENCOUNTER — Encounter: Payer: Self-pay | Admitting: Family Medicine

## 2020-08-03 ENCOUNTER — Telehealth: Payer: Self-pay | Admitting: Family Medicine

## 2020-08-03 LAB — CBC WITH DIFFERENTIAL/PLATELET
Basophils Absolute: 0 10*3/uL (ref 0.0–0.2)
Basos: 0 %
EOS (ABSOLUTE): 0.1 10*3/uL (ref 0.0–0.4)
Eos: 2 %
Hematocrit: 41.1 % (ref 34.0–46.6)
Hemoglobin: 13.5 g/dL (ref 11.1–15.9)
Immature Grans (Abs): 0 10*3/uL (ref 0.0–0.1)
Immature Granulocytes: 0 %
Lymphocytes Absolute: 2.4 10*3/uL (ref 0.7–3.1)
Lymphs: 47 %
MCH: 26.9 pg (ref 26.6–33.0)
MCHC: 32.8 g/dL (ref 31.5–35.7)
MCV: 82 fL (ref 79–97)
Monocytes Absolute: 0.4 10*3/uL (ref 0.1–0.9)
Monocytes: 8 %
Neutrophils Absolute: 2.2 10*3/uL (ref 1.4–7.0)
Neutrophils: 43 %
Platelets: 168 10*3/uL (ref 150–450)
RBC: 5.02 x10E6/uL (ref 3.77–5.28)
RDW: 12.9 % (ref 11.7–15.4)
WBC: 5.2 10*3/uL (ref 3.4–10.8)

## 2020-08-03 LAB — TSH: TSH: <0.005 u[IU]/mL — ABNORMAL LOW (ref 0.450–4.500)

## 2020-08-03 LAB — BASIC METABOLIC PANEL
BUN/Creatinine Ratio: 28 — ABNORMAL HIGH (ref 9–23)
BUN: 27 mg/dL — ABNORMAL HIGH (ref 6–24)
CO2: 22 mmol/L (ref 20–29)
Calcium: 9 mg/dL (ref 8.7–10.2)
Chloride: 106 mmol/L (ref 96–106)
Creatinine, Ser: 0.96 mg/dL (ref 0.57–1.00)
GFR calc Af Amer: 76 mL/min/{1.73_m2} (ref >59)
GFR calc non Af Amer: 66 mL/min/{1.73_m2} (ref >59)
Glucose: 105 mg/dL — ABNORMAL HIGH (ref 65–99)
Potassium: 4.2 mmol/L (ref 3.5–5.2)
Sodium: 142 mmol/L (ref 134–144)

## 2020-08-03 LAB — HEPATITIS B SURFACE ANTIGEN

## 2020-08-03 LAB — T4, FREE: Free T4: 2.23 ng/dL — ABNORMAL HIGH (ref 0.82–1.77)

## 2020-08-03 LAB — T3, FREE: T3, Free: 6.1 pg/mL — ABNORMAL HIGH (ref 2.0–4.4)

## 2020-08-03 LAB — HEPATITIS B SURFACE AG, CONFIRM: HBsAG Confirmation: POSITIVE — AB

## 2020-08-03 NOTE — Telephone Encounter (Signed)
Called patient to discuss results of recent Thyroid studies.  She has an appointment with endocrinology Oct 13 at [facility].  Also discussed Hep B results.  Patient reports has been a carrier for 30 plus years.  Patient will keep Endo appointment and will let me know if any changes. Carollee Leitz, MD [facility]

## 2020-08-05 ENCOUNTER — Encounter: Payer: Self-pay | Admitting: Family Medicine

## 2020-08-05 DIAGNOSIS — Z8619 Personal history of other infectious and parasitic diseases: Secondary | ICD-10-CM | POA: Insufficient documentation

## 2020-08-05 NOTE — Assessment & Plan Note (Signed)
Normotensive.  Asymptomatic. -Continue Amlodipine 10 mg daily

## 2020-08-05 NOTE — Assessment & Plan Note (Signed)
Likely Hyperthroid given low TSH and elevated T3/T4.  Reports intermittent palpitations without chest pain.  On exam no goiter, thyroid nodules. -Repeat TSH,T4, CBC and Bmet -Consider TSI and thyroid u/s repeated labs same as above. -Consider Endocrinology consult -Follow up as needed

## 2020-08-05 NOTE — Assessment & Plan Note (Signed)
Reports history of Hepatitis B for 30 plus years.  Asymptomatic.  Never needing treatment -HbSAg today

## 2020-08-05 NOTE — Progress Notes (Signed)
    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  She reports seeing a FNP and requested thyroid labs.  Results show low TSH and elevated T3/T4.  She reports weight loss but is intentional as she increased activity.  She also reports fatigue, she works 2 jobs.  She reports intermittent heart palpitations but denies any chest pain, SOB, nausea/vomiting, abdominal pain. She does endorse intermittent constipation and had some NB diarrhea today. She reports that she was previously treated for thyroid disease but unsure which medications she received.  She stopped meds as they did not agree with her.   Hep B Patient reports carrier of Hep B for about 30 plus years and never needing treatment  HTN Does not check BP at home.  Denies any headaches, chest pain, SOB or lower extremity edema.  Compliant with Amlodipine 10 mg daily.     PERTINENT  PMH / PSH:  Thyroid disease HTN Fatigue  OBJECTIVE:   BP 106/80   Pulse 86   Ht 5\' 5"  (1.651 m)   Wt 165 lb 3.2 oz (74.9 kg)   LMP 11/10/2004   SpO2 99%   BMI 27.49 kg/m    General: Alert and oriented, no apparent distress  Neck: nontender, no thyroid enlargement or nodules appreciated Cardiovascular: RRR with no murmurs noted Respiratory: CTA bilaterally  MSK: Upper extremity strength 5/5 bilaterally, Lower extremity strength 5/5 bilaterally    ASSESSMENT/PLAN:   Thyroid disease Likely Hyperthroid given low TSH and elevated T3/T4.  Reports intermittent palpitations without chest pain.  On exam no goiter, thyroid nodules. -Repeat TSH,T4, CBC and Bmet -Consider TSI and thyroid u/s repeated labs same as above. -Consider Endocrinology consult -Follow up as needed  H/O type B viral hepatitis Reports history of Hepatitis B for 30 plus years.  Asymptomatic.  Never needing treatment -HbSAg today   HTN (hypertension) Normotensive.  Asymptomatic. -Continue Amlodipine 10 mg daily       Carollee Leitz, MD [place]

## 2020-08-09 ENCOUNTER — Encounter: Payer: Self-pay | Admitting: Family Medicine

## 2020-09-13 ENCOUNTER — Ambulatory Visit: Payer: BC Managed Care – PPO | Attending: Family

## 2020-09-13 DIAGNOSIS — Z23 Encounter for immunization: Secondary | ICD-10-CM

## 2020-11-27 NOTE — Progress Notes (Signed)
   Covid-19 Vaccination Clinic  Name:  Malaina Mortellaro    MRN: [id number] DOB: 11/21/1960  11/27/2020  Ms. Bortle was observed post Covid-19 immunization for 15 minutes without incident. She was provided with Vaccine Information Sheet and instruction to access the V-Safe system.   Ms. Gailey was instructed to call 911 with any severe reactions post vaccine: Marland Kitchen Difficulty breathing  . Swelling of face and throat  . A fast heartbeat  . A bad rash all over body  . Dizziness and weakness   Immunizations Administered    Name Date Dose VIS Date Route   Moderna Covid-19 Booster Vaccine 09/13/2020  6:15 PM 0.25 mL 08/29/2020 Intramuscular   Manufacturer: Moderna   Lot: [id number]   [place]: [id number]

## 2021-06-18 ENCOUNTER — Encounter: Payer: BC Managed Care – PPO | Admitting: Family Medicine

## 2021-06-26 ENCOUNTER — Other Ambulatory Visit: Payer: Self-pay | Admitting: Family

## 2021-06-26 DIAGNOSIS — Z780 Asymptomatic menopausal state: Secondary | ICD-10-CM

## 2021-06-26 DIAGNOSIS — Z1231 Encounter for screening mammogram for malignant neoplasm of breast: Secondary | ICD-10-CM

## 2021-06-28 ENCOUNTER — Other Ambulatory Visit: Payer: BC Managed Care – PPO

## 2021-07-09 ENCOUNTER — Other Ambulatory Visit: Payer: Self-pay

## 2021-07-19 ENCOUNTER — Other Ambulatory Visit: Payer: Self-pay

## 2021-07-19 ENCOUNTER — Ambulatory Visit
Admission: RE | Admit: 2021-07-19 | Discharge: 2021-07-19 | Disposition: A | Discharge status: Home / Self Care | Payer: BC Managed Care – PPO | Source: Ambulatory Visit | Attending: Family | Admitting: Family

## 2021-07-19 DIAGNOSIS — Z1231 Encounter for screening mammogram for malignant neoplasm of breast: Secondary | ICD-10-CM

## 2022-04-15 ENCOUNTER — Encounter: Payer: Self-pay | Admitting: *Deleted

## 2022-11-25 ENCOUNTER — Encounter: Payer: Self-pay | Admitting: Gastroenterology

## 2023-08-23 IMAGING — MG MM DIGITAL SCREENING BILAT W/ TOMO AND CAD
8 series · 8 of 24 positions shown · non-contrast
Comparison: Previous exam(s).

CLINICAL DATA: Screening.

EXAM:
DIGITAL SCREENING BILATERAL MAMMOGRAM WITH TOMOSYNTHESIS AND CAD
TECHNIQUE: Bilateral screening digital craniocaudal and mediolateral oblique
mammograms were obtained. Bilateral screening digital breast
tomosynthesis was performed. The images were evaluated with
computer-aided detection.

[R MLO synth-2D]
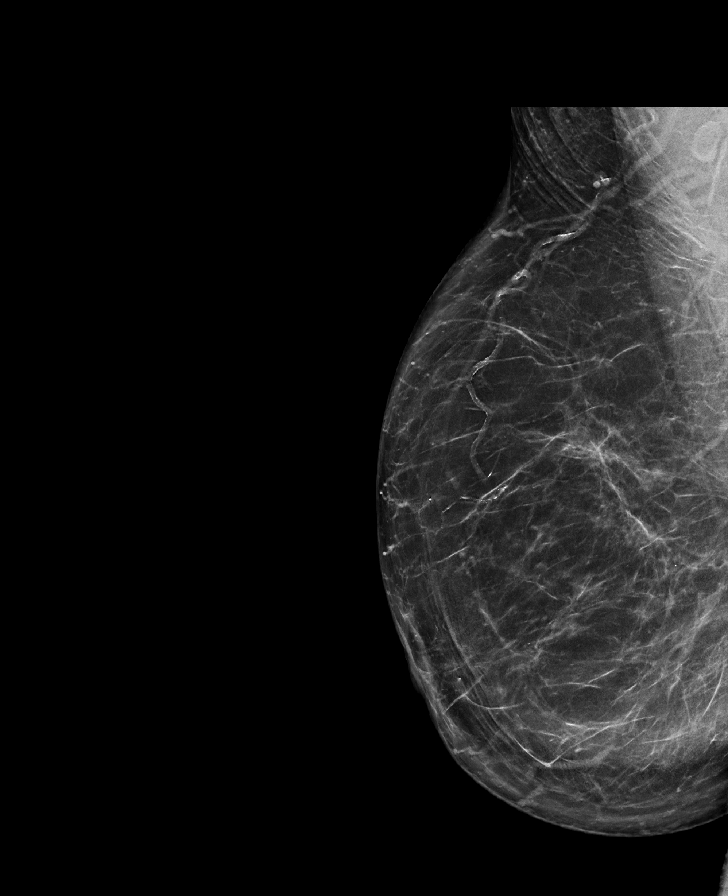

[L CC synth-2D]
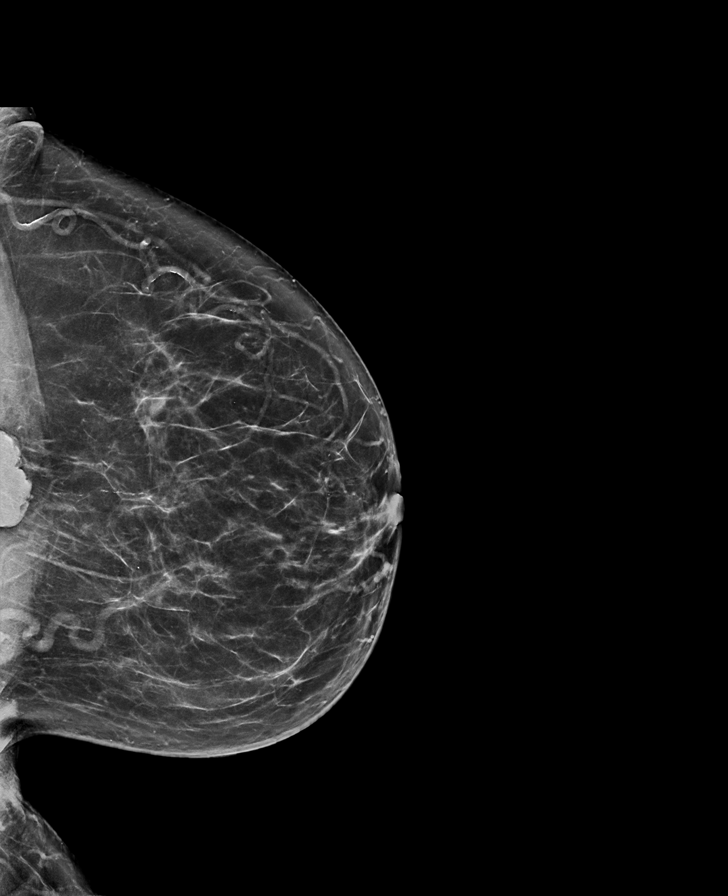

[R CC synth-2D]
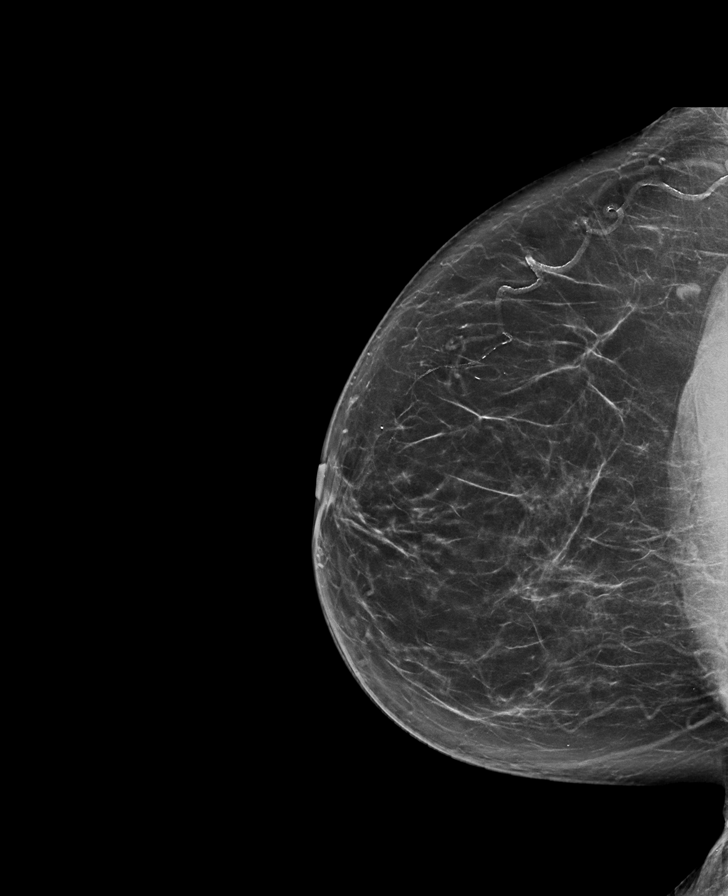

[L MLO synth-2D]
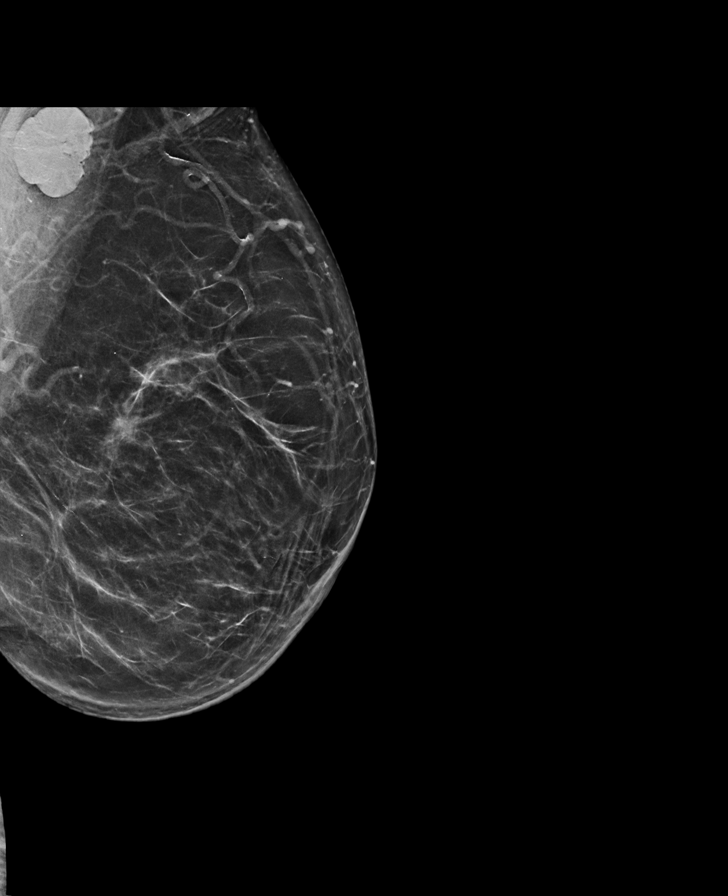

[R CC tomo · tomo slice 39/77.0]
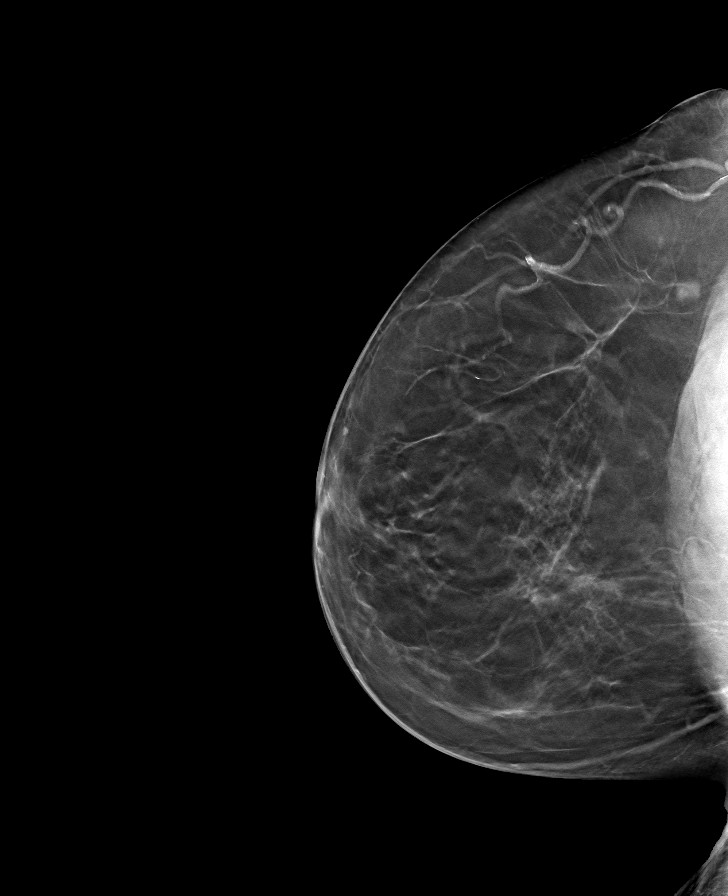

[R MLO tomo · tomo slice 41/80.0]
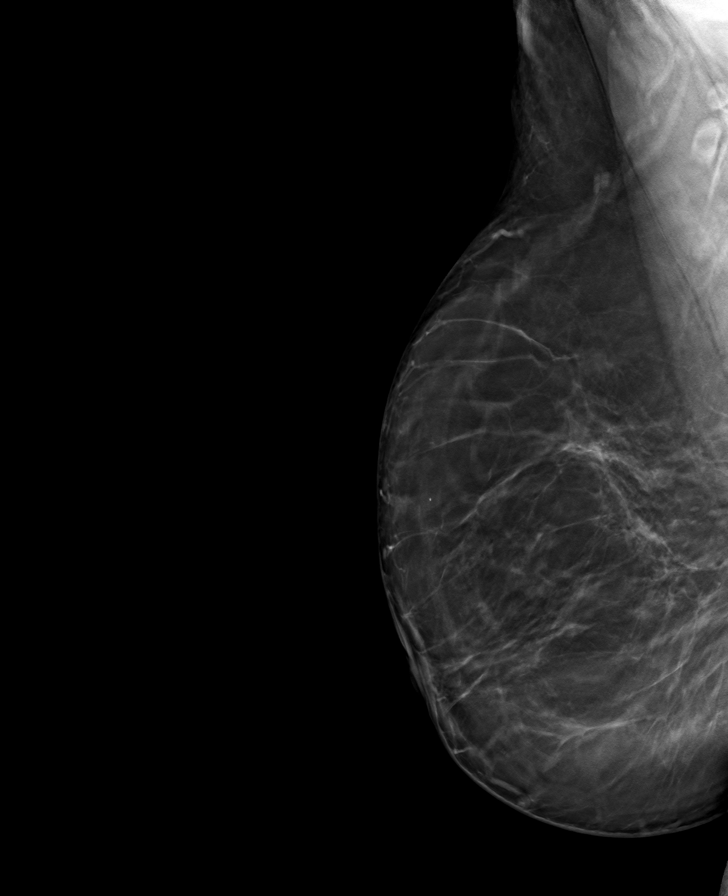

[L MLO tomo · tomo slice 39/76.0]
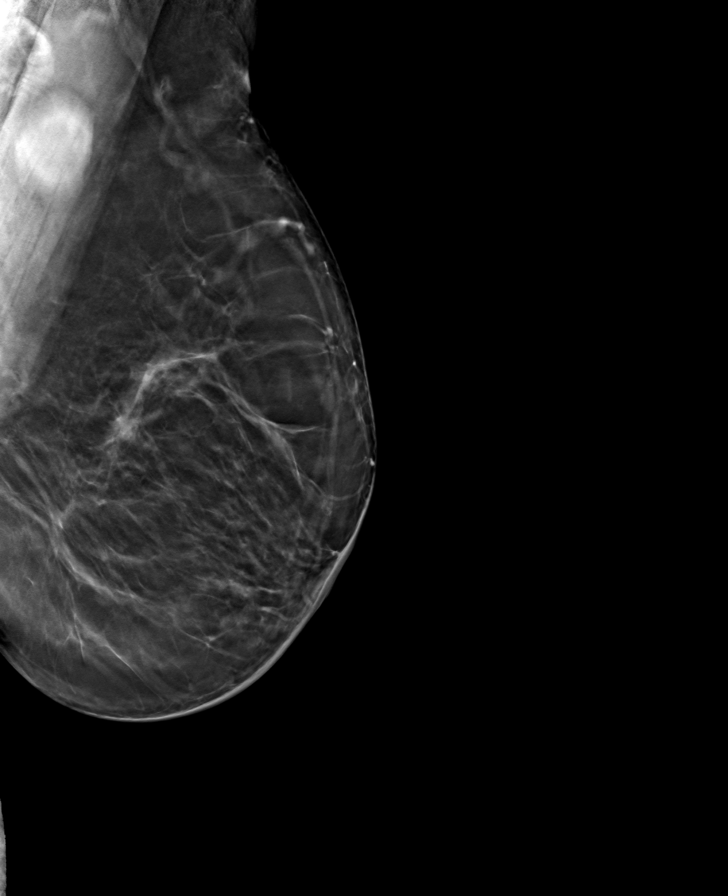

[L CC tomo · tomo slice 37/74.0]
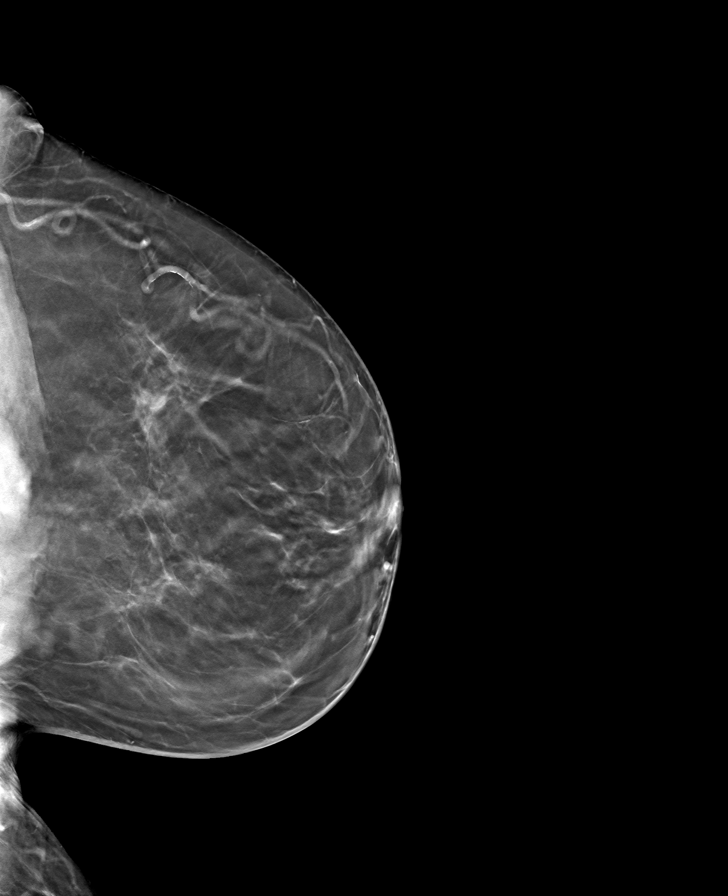

[8 of 24 positions shown; findings below may reference images not displayed]

ACR Breast Density Category b: There are scattered areas of
fibroglandular density.
FINDINGS: There are no findings suspicious for malignancy.
IMPRESSION: No mammographic evidence of malignancy. A result letter of this
screening mammogram will be mailed directly to the patient.

RECOMMENDATION:
Screening mammogram in one year. (Code:51-O-LD2)

BI-RADS CATEGORY  1: Negative.
